# Patient Record
Sex: Male | Born: 1965 | Race: White | Hispanic: No | Marital: Single | Smoking: Former smoker
Health system: Southern US, Community
[De-identification: ages and names within clinical notes are randomized; demographics above are authoritative.]

## PROBLEM LIST (undated history)

## (undated) DIAGNOSIS — I10 Essential (primary) hypertension: Secondary | ICD-10-CM

## (undated) HISTORY — DX: Essential (primary) hypertension: I10

---

## 2014-08-21 ENCOUNTER — Emergency Department (HOSPITAL_BASED_OUTPATIENT_CLINIC_OR_DEPARTMENT_OTHER)
Admission: EM | Admit: 2014-08-21 | Discharge: 2014-08-21 | Disposition: A | Discharge status: Home / Self Care | Payer: BC Managed Care – PPO | Attending: Emergency Medicine | Admitting: Emergency Medicine

## 2014-08-21 ENCOUNTER — Emergency Department (HOSPITAL_BASED_OUTPATIENT_CLINIC_OR_DEPARTMENT_OTHER): Payer: BC Managed Care – PPO

## 2014-08-21 ENCOUNTER — Encounter (HOSPITAL_BASED_OUTPATIENT_CLINIC_OR_DEPARTMENT_OTHER): Payer: Self-pay | Admitting: *Deleted

## 2014-08-21 DIAGNOSIS — R42 Dizziness and giddiness: Secondary | ICD-10-CM | POA: Insufficient documentation

## 2014-08-21 DIAGNOSIS — R079 Chest pain, unspecified: Secondary | ICD-10-CM | POA: Insufficient documentation

## 2014-08-21 DIAGNOSIS — Z87891 Personal history of nicotine dependence: Secondary | ICD-10-CM | POA: Diagnosis not present

## 2014-08-21 LAB — CBC WITH DIFFERENTIAL/PLATELET
Basophils Absolute: 0 10*3/uL (ref 0.0–0.1)
Basophils Relative: 0 % (ref 0–1)
EOS ABS: 0.1 10*3/uL (ref 0.0–0.7)
Eosinophils Relative: 1 % (ref 0–5)
HCT: 46.5 % (ref 39.0–52.0)
HEMOGLOBIN: 15.9 g/dL (ref 13.0–17.0)
Lymphocytes Relative: 23 % (ref 12–46)
Lymphs Abs: 2.1 10*3/uL (ref 0.7–4.0)
MCH: 30.8 pg (ref 26.0–34.0)
MCHC: 34.2 g/dL (ref 30.0–36.0)
MCV: 90.1 fL (ref 78.0–100.0)
MONOS PCT: 7 % (ref 3–12)
Monocytes Absolute: 0.6 10*3/uL (ref 0.1–1.0)
NEUTROS PCT: 69 % (ref 43–77)
Neutro Abs: 6.5 10*3/uL (ref 1.7–7.7)
Platelets: 216 10*3/uL (ref 150–400)
RBC: 5.16 MIL/uL (ref 4.22–5.81)
RDW: 12.8 % (ref 11.5–15.5)
WBC: 9.4 10*3/uL (ref 4.0–10.5)

## 2014-08-21 LAB — BASIC METABOLIC PANEL
Anion gap: 12 (ref 5–15)
BUN: 21 mg/dL (ref 6–23)
CO2: 28 mEq/L (ref 19–32)
Calcium: 9.7 mg/dL (ref 8.4–10.5)
Chloride: 101 mEq/L (ref 96–112)
Creatinine, Ser: 1 mg/dL (ref 0.50–1.35)
GFR calc Af Amer: >90 mL/min (ref >90)
GFR, EST NON AFRICAN AMERICAN: 88 mL/min — AB (ref >90)
Glucose, Bld: 125 mg/dL — ABNORMAL HIGH (ref 70–99)
Potassium: 4.4 mEq/L (ref 3.7–5.3)
Sodium: 141 mEq/L (ref 137–147)

## 2014-08-21 LAB — TROPONIN I: Troponin I: <0.3 ng/mL (ref <0.30)

## 2014-08-21 MED ORDER — OMEPRAZOLE 20 MG PO CPDR: 20.0000 mg | DELAYED_RELEASE_CAPSULE | Freq: Every day | ORAL | Status: DC

## 2014-08-21 MED ORDER — ASPIRIN 81 MG PO CHEW
324.0000 mg | CHEWABLE_TABLET | Freq: Once | ORAL | Status: AC
  Administered 2014-08-21: 324 mg via ORAL
  Filled 2014-08-21: qty 4

## 2014-08-21 MED ORDER — GI COCKTAIL ~~LOC~~
30.0000 mL | Freq: Once | ORAL | Status: AC
Start: 2014-08-21 — End: 2014-08-21
  Administered 2014-08-21: 30 mL via ORAL
  Filled 2014-08-21: qty 30

## 2014-08-21 NOTE — Discharge Instructions (Signed)
Chest Pain (Nonspecific) °It is often hard to give a specific diagnosis for the cause of chest pain. There is always a chance that your pain could be related to something serious, such as a heart attack or a blood clot in the lungs. You need to follow up with your health care provider for further evaluation. °CAUSES  °· Heartburn. °· Pneumonia or bronchitis. °· Anxiety or stress. °· Inflammation around your heart (pericarditis) or lung (pleuritis or pleurisy). °· A blood clot in the lung. °· A collapsed lung (pneumothorax). It can develop suddenly on its own (spontaneous pneumothorax) or from trauma to the chest. °· Shingles infection (herpes zoster virus). °The chest wall is composed of bones, muscles, and cartilage. Any of these can be the source of the pain. °· The bones can be bruised by injury. °· The muscles or cartilage can be strained by coughing or overwork. °· The cartilage can be affected by inflammation and become sore (costochondritis). °DIAGNOSIS  °Lab tests or other studies may be needed to find the cause of your pain. Your health care provider may have you take a test called an ambulatory electrocardiogram (ECG). An ECG records your heartbeat patterns over a 24-hour period. You may also have other tests, such as: °· Transthoracic echocardiogram (TTE). During echocardiography, sound waves are used to evaluate how blood flows through your heart. °· Transesophageal echocardiogram (TEE). °· Cardiac monitoring. This allows your health care provider to monitor your heart rate and rhythm in real time. °· Holter monitor. This is a portable device that records your heartbeat and can help diagnose heart arrhythmias. It allows your health care provider to track your heart activity for several days, if needed. °· Stress tests by exercise or by giving medicine that makes the heart beat faster. °TREATMENT  °· Treatment depends on what may be causing your chest pain. Treatment may include: °¨ Acid blockers for  heartburn. °¨ Anti-inflammatory medicine. °¨ Pain medicine for inflammatory conditions. °¨ Antibiotics if an infection is present. °· You may be advised to change lifestyle habits. This includes stopping smoking and avoiding alcohol, caffeine, and chocolate. °· You may be advised to keep your head raised (elevated) when sleeping. This reduces the chance of acid going backward from your stomach into your esophagus. °Most of the time, nonspecific chest pain will improve within 2-3 days with rest and mild pain medicine.  °HOME CARE INSTRUCTIONS  °· If antibiotics were prescribed, take them as directed. Finish them even if you start to feel better. °· For the next few days, avoid physical activities that bring on chest pain. Continue physical activities as directed. °· Do not use any tobacco products, including cigarettes, chewing tobacco, or electronic cigarettes. °· Avoid drinking alcohol. °· Only take medicine as directed by your health care provider. °· Follow your health care provider's suggestions for further testing if your chest pain does not go away. °· Keep any follow-up appointments you made. If you do not go to an appointment, you could develop lasting (chronic) problems with pain. If there is any problem keeping an appointment, call to reschedule. °SEEK MEDICAL CARE IF:  °· Your chest pain does not go away, even after treatment. °· You have a rash with blisters on your chest. °· You have a fever. °SEEK IMMEDIATE MEDICAL CARE IF:  °· You have increased chest pain or pain that spreads to your arm, neck, jaw, back, or abdomen. °· You have shortness of breath. °· You have an increasing cough, or you cough   up blood. °· You have severe back or abdominal pain. °· You feel nauseous or vomit. °· You have severe weakness. °· You faint. °· You have chills. °This is an emergency. Do not wait to see if the pain will go away. Get medical help at once. Call your local emergency services (911 in [place] Do not drive  yourself to the hospital. °MAKE SURE YOU:  °· Understand these instructions. °· Will watch your condition. °· Will get help right away if you are not doing well or get worse. °Document Released: 06/17/2005 Document Revised: 09/12/2013 Document Reviewed: 04/12/2008 °ExitCare® Patient Information ©2015 [employer]. This information is not intended to replace advice given to you by your health care provider. Make sure you discuss any questions you have with your health care provider. ° ° °Emergency Department Resource Guide °1) Find a Doctor and Pay Out of Pocket °Although you won't have to find out who is covered by your insurance plan, it is a good idea to ask around and get recommendations. You will then need to call the office and see if the doctor you have chosen will accept you as a new patient and what types of options they offer for patients who are self-pay. Some doctors offer discounts or will set up payment plans for their patients who do not have insurance, but you will need to ask so you aren't surprised when you get to your appointment. ° °2) Contact Your Local Health Department °Not all health departments have doctors that can see patients for sick visits, but many do, so it is worth a call to see if yours does. If you don't know where your local health department is, you can check in your phone book. The CDC also has a tool to help you locate your state's health department, and many state websites also have listings of all of their local health departments. ° °3) Find a Walk-in Clinic °If your illness is not likely to be very severe or complicated, you may want to try a walk in clinic. These are popping up all over the country in pharmacies, drugstores, and shopping centers. They're usually staffed by nurse practitioners or physician assistants that have been trained to treat common illnesses and complaints. They're usually fairly quick and inexpensive. However, if you have serious medical issues or  chronic medical problems, these are probably not your best option. ° °No Primary Care Doctor: °- Call [facility] at  832-8000 - they can help you locate a primary care doctor that  accepts your insurance, provides certain services, etc. °- Physician Referral Service- 1-800-533-3463 ° °Chronic Pain Problems: °Organization         Address  Phone   Notes  °[facility]  (336) 297-2271 Patients need to be referred by their primary care doctor.  ° °Medication Assistance: °Organization         Address  Phone   Notes  °[place] [facility] [address] °[place], [place] [address] (336) 641-8030 --Must be a resident of [place] °-- Must have NO insurance coverage whatsoever (no Medicaid/ Medicare, etc.) °-- The pt. MUST have a primary care doctor that directs their care regularly and follows them in the community °  °[facility]  (866) 331-1348   °[employer]  (888) 892-1162   ° °Agencies that provide inexpensive medical care: °Organization         Address  Phone   Notes  °[facility]  (  336) 832-8035   °[facility]    (336) 832-7272   °[facility] [address] °[place], [place] [address] (336) 832-4777   °[facility] [address], °National (336) 271-4999   °[facility]    (336) 373-0678   °[facility]    (336) 272-1050   °[facility] ° [address], [place] Phone:  (336) 832-4444, Fax:  (336) 832-4440 Hours of Operation:  9 am - 6 pm, M-F.  Also accepts Medicaid/Medicare and self-pay.  °[facility] ° [address], [place] Phone: (336) 832-3150, Fax: (336) 832-3151. Hours of Operation:  8:30 am - 5:30 pm, M-F.  Also accepts Medicaid and self-pay.  °[facility] [place] [address], [place] Phone: (336) 878-6027   °[facility] [address], [place], [place]  (336)723-1848, Ext. 123 Mondays & Thursdays: 7-9 AM.  First 15 patients are seen on a first come, first serve basis. °  ° °Medicaid-accepting [place] Providers: ° °Organization         Address  Phone   Notes  °[facility] [address], [place] (336) 641-2100 Also accepts self-pay patients.  °[facility] [address], [place] ° (336) 856-9996   °[facility] [address], [place] (336) 288-8857   °[facility] [address] [place] [address], [place] (336) 299-7000   °Veita Bland [address], [place]  ° (336) 373-1557 Only accepts [place] Access Medicaid patients after they have their name applied to their card.  ° °Self-Pay (no insurance) in [place]: ° °Organization         Address  Phone   Notes  °Sickle Cell Patients, [facility] [address], [place] (336) 832-1970   °[facility] [address], [place] (336) 832-4400   °[facility] [place] ° [address], [place] (336) 992-4800   °[facility]/Dr. Osei-Bonsu ° [address] [place] [address], [place] or [address], [place] (336) 841-8500 Phone number for both [place] and [place] locations is the same.  °[facility] [address], [place] (336) 299-0000   °[facility] [address] [place] [address], [place] or [address] (336) 852-7530 °(336) 878-2260   °[facility] [address], [place] (336) 350-1642, phone; (336) 294-5005, fax Sees patients 1st and 3rd Saturday of every month.  Must not qualify for public or private insurance (i.e. Medicaid, Medicare, [place] Health Choice, Veterans' Benefits) • Household income should be no more than 200% of the poverty level •The clinic cannot treat you if you are pregnant or think you are pregnant • Sexually transmitted  diseases are not treated at the clinic.  ° ° °Dental Care: °Organization         Address  Phone  Notes  °[facility] [facility] [address], [place] (336) 641-6152 Accepts children up to age 21 who are enrolled in Medicaid or [place] Health Choice; pregnant women with a Medicaid card; and children who have applied for Medicaid or [place] Health Choice, but were declined, whose parents can pay a reduced fee at time of service.  °[facility] [place]    [address], [place] (336) 641-7733 Accepts children up to age 21 who are enrolled in Medicaid or [place] Health Choice; pregnant women with a Medicaid card; and children who have applied for Medicaid or [place] Health Choice, but were declined, whose parents can pay a reduced fee at time of service.  °[facility] ° [address], [place] (336) 641-4533 Patients are seen by appointment only. Walk-ins are not accepted. [facility] will see patients 18 years of age and older. °Monday - Tuesday (8am-5pm) °Most Wednesdays (8:30-5pm) °$30 per visit, cash only  °[facility] ° [address], [place] (336) 641-4533 Patients are seen by appointment only. Walk-ins are not accepted. [facility] will see patients 18 years of age and older. °One Wednesday Evening (Monthly: Volunteer Based).  $30 per visit, cash only  °[facility]  (919) 537-3737 for adults; Children under age 4, call Graduate Pediatric Dentistry at (919) [phone]. Children aged 4-14, please call (919) [phone] to request a pediatric application. ° Dental services are provided in all areas of dental care including fillings, crowns and bridges, complete and partial dentures, implants, gum treatment, root canals, and extractions. Preventive care is also provided. Treatment is provided to both adults and children. °Patients are selected via a  lottery and there is often a waiting list. °  °[facility] [address], °[place] ° (336) 763-8833 www.drcivils.com °  °[facility] [address], [place], [place] (336)723-1848, Ext. 123 Second and Fourth Thursday of each month, opens at 6:30 AM; Clinic ends at 9 AM.  Patients are seen on a first-come first-served basis, and a limited number are seen during each clinic.  ° °[facility] ° [address], [place], [place] (336) 723-7904   Eligibility Requirements °You must have lived in [place], [place], or [place] counties for at least the last three months. °  You cannot be eligible for state or federal sponsored healthcare insurance, including Veterans Administration, Medicaid, or Medicare. °  You generally cannot be eligible for healthcare insurance through your employer.  °  How to apply: °Eligibility screenings are held every Tuesday and Wednesday afternoon from 1:00 pm until 4:00 pm. You do not need an appointment for the interview!  °[facility] [address], [place], [place] 336-631-2330   °[facility]  336-342-8273   °[facility]  336-703-3100   °[facility]  336-570-6415   ° °Behavioral Health Resources in the Community: °Intensive Outpatient Programs °Organization         Address  Phone  Notes  °[facility] [address], [place], [place] 336-878-6098   °[facility] [address], [place], [place] 336-832-9800   °[facility] [address], [place], [place] ° 336-882-2125   °[facility] [address],  °[place], [place] 1-800-853-5163 or 336-641-4981   °Substance Abuse Resources °Organization         Address  Phone  Notes  °[facility]  336-882-2125   °[facility]  336-784-9470   °[facility]  336-285-9073   °[facility]  336-845-3988   °Residential &  Outpatient Substance Abuse Program  1-800-659-3381   °Psychological Services °Organization         Address  Phone  Notes  °[facility]  336- 832-9600   °  [facility]  336- 378-7881   °[facility] [address], [place] 1-800-853-5163 or 336-641-4981   ° °Mobile Crisis Teams °Organization         Address  Phone  Notes  °[facility], Mobile Crisis Care Unit  1-877-626-1772   °Assertive °[facility] ° [address] [place], [place] 336-834-9664   °Sharon DeEsch [address] °[place] 336-554-5454   ° °Self-Help/Support Groups °Organization         Address  Phone             Notes  °[facility] [place] [facility] - variety of support groups  336- 373-1402 Call for more information  °Narcotics Anonymous (NA), [facility] [address], °[place]  2 meetings at this location  ° °Residential Treatment Programs °Organization         Address  Phone  Notes  °[facility] [address],    °[place]  1-866-801-8205   °[facility] ° [address], [place], [place] 704-293-8524   °[facility] [address], [place] 336-845-3988 Admissions: 8am-3pm M-F  °[facility] [address]    °[place], [place] 336-841-1104   °[facility] [address], [place], [place] 336-379-7146   °[facility] [address]  °[place], [place] 336-285-9073   °[facility] - Intensive Outpatient [address], [place], [place] 336-852-3033   °[facility] [address]  °[place], [place] 1-877-615-2722 or 336-784-9470   °[facility] [address] [place], [place] 336-227-7417 Accepts Medicaid  °[facility] [address]  °[place] 1-800-659-3381 Substance Abuse/Addiction Treatment  ° °[place] Behavioral Health Resources °Organization          Address  Phone  Notes  °[facility]  (888) 581-9988   °Julie Brannon, PhD [address] [place], [place]   (336) 349-5553 or (336) 951-0000   °[facility]   [address] °[place], [place] (336) 349-4454   °[facility] [address], [place], [place] (336) 342-8316 Insurance/Medicaid/sponsorship through [facility]  °[facility] [address]                                    [place], [place] (336) 342-8316 Therapy/tele-psych/case  °[facility] [address]  ° [place], [place] (336) 349-2233    °Dr. Arfeen  (336) 349-4544   °[facility] [place]  [employer] [place] [facility] 1) [address], [place] °2) [address], [place] °3)  [address], [place] (336) 349-3220 °(336) 342-7768 ° °(336) 342-8140   °[place] [facility] (336) 342-1394 or (336) 342-3537 (After Hours)    ° ° ° °

## 2014-08-21 NOTE — ED Notes (Signed)
Pt to room 6 in w/c, able to stand and walk to bed in nad. Pt reports approx one week of off and on chest "squeezing" sensation. Denies any sob, denies any n/v, diaphoresis or any other c/o.

## 2014-08-21 NOTE — ED Provider Notes (Signed)
CSN: [id number]     Arrival date & time 08/21/14  1035 History   First MD Initiated Contact with Patient 08/21/14 1144     Chief Complaint  Patient presents with  . Chest Pain     (Consider location/radiation/quality/duration/timing/severity/associated sxs/prior Treatment) Patient is a 48 y.o. male presenting with chest pain.  Chest Pain Pain location:  Substernal area Pain quality: tightness   Pain radiates to:  Does not radiate Pain severity:  Moderate Onset quality:  Gradual Duration:  4 days Timing:  Intermittent (but constant for past several hours\) Progression:  Unchanged Chronicity:  New Context comment:  He has been checking his blood pressure for past few days and has noted that it has been high Relieved by:  Nothing Exacerbated by: lying flat, at night. Ineffective treatments:  None tried Associated symptoms: dizziness ("a little bit.  I can work through it")   Associated symptoms: no diaphoresis, no nausea, no near-syncope, no shortness of breath and not vomiting     History reviewed. No pertinent past medical history. History reviewed. No pertinent past surgical history. History reviewed. No pertinent family history. History  Substance Use Topics  . Smoking status: Former Games developermoker  . Smokeless tobacco: Not on file  . Alcohol Use: Not on file    Review of Systems  Constitutional: Negative for diaphoresis.  Respiratory: Negative for shortness of breath.   Cardiovascular: Positive for chest pain. Negative for near-syncope.  Gastrointestinal: Negative for nausea and vomiting.  Neurological: Positive for dizziness ("a little bit.  I can work through it").  All other systems reviewed and are negative.     Allergies  Review of patient's allergies indicates no known allergies.  Home Medications   Prior to Admission medications   Not on File   BP 153/80 mmHg  Pulse 79  Temp(Src) 98.5 F (36.9 C) (Oral)  Resp 18  Ht 5\' 11"  (1.803 m)  Wt 230 lb (104.327  kg)  BMI 32.09 kg/m2  SpO2 99% Physical Exam  Constitutional: He is oriented to person, place, and time. He appears well-developed and well-nourished. No distress.  HENT:  Head: Normocephalic and atraumatic.  Mouth/Throat: Oropharynx is clear and moist.  Eyes: Conjunctivae are normal. Pupils are equal, round, and reactive to light. No scleral icterus.  Neck: Neck supple.  Cardiovascular: Normal rate, regular rhythm, normal heart sounds and intact distal pulses.   No murmur heard. Pulmonary/Chest: Effort normal and breath sounds normal. No stridor. No respiratory distress. He has no wheezes. He has no rales. He exhibits no tenderness.  Abdominal: Soft. He exhibits no distension. There is no tenderness.  Musculoskeletal: Normal range of motion. He exhibits no edema.  Neurological: He is alert and oriented to person, place, and time.  Skin: Skin is warm and dry. No rash noted.  Psychiatric: He has a normal mood and affect. His behavior is normal.  Nursing note and vitals reviewed.   ED Course  Procedures (including critical care time) Labs Review Labs Reviewed  BASIC METABOLIC PANEL - Abnormal; Notable for the following:    Glucose, Bld 125 (*)    GFR calc non Af Amer 88 (*)    All other components within normal limits  CBC WITH DIFFERENTIAL  TROPONIN I    Imaging Review Dg Chest 2 View  08/21/2014   CLINICAL DATA:  Chest pain for several days  EXAM: CHEST  2 VIEW  COMPARISON:  None.  FINDINGS: Lungs are clear. Heart size and pulmonary vascularity are normal. No pneumothorax.  No adenopathy. No bone lesions.  IMPRESSION: No edema or consolidation.   Electronically Signed   By: Bretta BangWilliam  Woodruff M.D.   On: 08/21/2014 13:09  All radiology studies independently viewed by me.      EKG Interpretation   Date/Time:  Tuesday August 21 2014 10:46:52 EST Ventricular Rate:  72 PR Interval:  172 QRS Duration: 88 QT Interval:  372 QTC Calculation: 407 R Axis:   59 Text  Interpretation:  Normal sinus rhythm Normal ECG No old tracing to  compare Confirmed by [facility]  MD, TREY ([id number]) on 08/21/2014 12:03:41 PM      MDM   Final diagnoses:  Chest pain    Well appearing 48 yo male with intermittent chest pain for past several days, constant this morning.  Low risk for ACS.  Story sounds more consistent with GI related chest pain.  Unlikely dissection or PE.  Troponin is negative. He has had constant chest pain since at least 5:30 this morning. I don't think he needs a second troponin, as his history is very unlikely represent ACS. Plan DC home with Prilosec, return precautions, and PCP follow-up.  Warnell Foresterrey Lowry Bala, MD 08/21/14 [phone]

## 2015-03-07 ENCOUNTER — Emergency Department (HOSPITAL_BASED_OUTPATIENT_CLINIC_OR_DEPARTMENT_OTHER): Payer: 59

## 2015-03-07 ENCOUNTER — Encounter (HOSPITAL_BASED_OUTPATIENT_CLINIC_OR_DEPARTMENT_OTHER): Payer: Self-pay | Admitting: *Deleted

## 2015-03-07 ENCOUNTER — Emergency Department (HOSPITAL_BASED_OUTPATIENT_CLINIC_OR_DEPARTMENT_OTHER)
Admission: EM | Admit: 2015-03-07 | Discharge: 2015-03-07 | Disposition: A | Discharge status: Home / Self Care | Payer: 59 | Attending: Emergency Medicine | Admitting: Emergency Medicine

## 2015-03-07 DIAGNOSIS — R319 Hematuria, unspecified: Secondary | ICD-10-CM

## 2015-03-07 DIAGNOSIS — Z79899 Other long term (current) drug therapy: Secondary | ICD-10-CM | POA: Diagnosis not present

## 2015-03-07 DIAGNOSIS — N390 Urinary tract infection, site not specified: Secondary | ICD-10-CM | POA: Diagnosis not present

## 2015-03-07 DIAGNOSIS — R109 Unspecified abdominal pain: Secondary | ICD-10-CM

## 2015-03-07 DIAGNOSIS — Z87891 Personal history of nicotine dependence: Secondary | ICD-10-CM | POA: Diagnosis not present

## 2015-03-07 LAB — URINALYSIS, ROUTINE W REFLEX MICROSCOPIC
Glucose, UA: NEGATIVE mg/dL
Ketones, ur: 15 mg/dL — AB
NITRITE: NEGATIVE
Protein, ur: 30 mg/dL — AB
Specific Gravity, Urine: 1.029 (ref 1.005–1.030)
UROBILINOGEN UA: 2 mg/dL — AB (ref 0.0–1.0)
pH: 6 (ref 5.0–8.0)

## 2015-03-07 LAB — URINE MICROSCOPIC-ADD ON

## 2015-03-07 LAB — CBC WITH DIFFERENTIAL/PLATELET
BASOS PCT: 0 % (ref 0–1)
Basophils Absolute: 0 10*3/uL (ref 0.0–0.1)
EOS ABS: 0.2 10*3/uL (ref 0.0–0.7)
Eosinophils Relative: 1 % (ref 0–5)
HEMATOCRIT: 44.7 % (ref 39.0–52.0)
HEMOGLOBIN: 15.4 g/dL (ref 13.0–17.0)
Lymphocytes Relative: 21 % (ref 12–46)
Lymphs Abs: 2.4 10*3/uL (ref 0.7–4.0)
MCH: 31.2 pg (ref 26.0–34.0)
MCHC: 34.5 g/dL (ref 30.0–36.0)
MCV: 90.5 fL (ref 78.0–100.0)
MONO ABS: 1 10*3/uL (ref 0.1–1.0)
MONOS PCT: 9 % (ref 3–12)
Neutro Abs: 7.8 10*3/uL — ABNORMAL HIGH (ref 1.7–7.7)
Neutrophils Relative %: 69 % (ref 43–77)
Platelets: 247 10*3/uL (ref 150–400)
RBC: 4.94 MIL/uL (ref 4.22–5.81)
RDW: 12.7 % (ref 11.5–15.5)
WBC: 11.3 10*3/uL — ABNORMAL HIGH (ref 4.0–10.5)

## 2015-03-07 LAB — BASIC METABOLIC PANEL
Anion gap: 9 (ref 5–15)
BUN: 27 mg/dL — ABNORMAL HIGH (ref 6–20)
CO2: 25 mmol/L (ref 22–32)
CREATININE: 1.01 mg/dL (ref 0.61–1.24)
Calcium: 9.1 mg/dL (ref 8.9–10.3)
Chloride: 106 mmol/L (ref 101–111)
GFR calc Af Amer: >60 mL/min (ref >60)
Glucose, Bld: 108 mg/dL — ABNORMAL HIGH (ref 65–99)
Potassium: 3.7 mmol/L (ref 3.5–5.1)
Sodium: 140 mmol/L (ref 135–145)

## 2015-03-07 MED ORDER — CEPHALEXIN 500 MG PO CAPS: 500.0000 mg | ORAL_CAPSULE | Freq: Three times a day (TID) | ORAL | Status: DC

## 2015-03-07 MED ORDER — OXYCODONE-ACETAMINOPHEN 5-325 MG PO TABS: 1.0000 | ORAL_TABLET | ORAL | Status: DC | PRN

## 2015-03-07 MED ORDER — KETOROLAC TROMETHAMINE 60 MG/2ML IM SOLN
60.0000 mg | Freq: Once | INTRAMUSCULAR | Status: AC
  Administered 2015-03-07: 60 mg via INTRAMUSCULAR
  Filled 2015-03-07: qty 2

## 2015-03-07 NOTE — ED Provider Notes (Signed)
CSN: [id number]     Arrival date & time 03/07/15  1726 History   First MD Initiated Contact with Patient 03/07/15 1745     Chief Complaint  Patient presents with  . Flank Pain     (Consider location/radiation/quality/duration/timing/severity/associated sxs/prior Treatment) HPI  49 year old male history of kidney stones this today with left flank pain that began earlier this morning. Has been intermittent throughout the day. He has noticed dark urine. It is similar to previous kidney stones. It is currently decreased at 1-2 out of 10. He has had nausea but no vomiting. He has taken over-the-counter pain medicine with some relief.  History reviewed. No pertinent past medical history. History reviewed. No pertinent past surgical history. No family history on file. History  Substance Use Topics  . Smoking status: Former Games developer  . Smokeless tobacco: Not on file  . Alcohol Use: No    Review of Systems  All other systems reviewed and are negative.     Allergies  Review of patient's allergies indicates no known allergies.  Home Medications   Prior to Admission medications   Medication Sig Start Date End Date Taking? Authorizing Provider  omeprazole (PRILOSEC) 20 MG capsule Take 1 capsule (20 mg total) by mouth daily. 08/21/14   Blake Divine, MD   BP 164/86 mmHg  Pulse 67  Temp(Src) 97.4 F (36.3 C) (Oral)  Resp 18  Ht 5\' 10"  (1.778 m)  Wt 230 lb (104.327 kg)  BMI 33.00 kg/m2  SpO2 99% Physical Exam  Constitutional: He is oriented to person, place, and time. He appears well-developed and well-nourished.  HENT:  Head: Normocephalic and atraumatic.  Right Ear: External ear normal.  Left Ear: External ear normal.  Nose: Nose normal.  Mouth/Throat: Oropharynx is clear and moist.  Eyes: Conjunctivae and EOM are normal. Pupils are equal, round, and reactive to light.  Neck: Normal range of motion. Neck supple.  Cardiovascular: Normal rate, regular rhythm, normal heart sounds  and intact distal pulses.   Pulmonary/Chest: Effort normal and breath sounds normal. No respiratory distress. He has no wheezes. He exhibits no tenderness.  Abdominal: Soft. Bowel sounds are normal. He exhibits no distension and no mass. There is no tenderness. There is no guarding.  Musculoskeletal: Normal range of motion.  Neurological: He is alert and oriented to person, place, and time. He has normal reflexes. He exhibits normal muscle tone. Coordination normal.  Skin: Skin is warm and dry.  Psychiatric: He has a normal mood and affect. His behavior is normal. Judgment and thought content normal.  Nursing note and vitals reviewed.   ED Course  Procedures (including critical care time) Labs Review Labs Reviewed  URINALYSIS, ROUTINE W REFLEX MICROSCOPIC (NOT AT [facility]) - Abnormal; Notable for the following:    Color, Urine RED (*)    APPearance CLOUDY (*)    Hgb urine dipstick LARGE (*)    Bilirubin Urine SMALL (*)    Ketones, ur 15 (*)    Protein, ur 30 (*)    Urobilinogen, UA 2.0 (*)    Leukocytes, UA SMALL (*)    All other components within normal limits  BASIC METABOLIC PANEL - Abnormal; Notable for the following:    Glucose, Bld 108 (*)    BUN 27 (*)    All other components within normal limits  CBC WITH DIFFERENTIAL/PLATELET - Abnormal; Notable for the following:    WBC 11.3 (*)    Neutro Abs 7.8 (*)    All other components within normal  limits  URINE MICROSCOPIC-ADD ON - Abnormal; Notable for the following:    Bacteria, UA FEW (*)    All other components within normal limits    Imaging Review Dg Abd 1 View  03/07/2015   CLINICAL DATA:  Sudden onset of left flank pain today. History of kidney stones. Initial encounter.  EXAM: ABDOMEN - 1 VIEW  COMPARISON:  07/26/2008 radiographs.  CT 11/23/2006.  FINDINGS: The upper abdomen is incompletely visualized. The upper pole of the left kidney may be partially excluded. No calcifications are seen over the kidneys or expected  course of the ureters. Right pelvic calcification is unchanged, consistent with a phlebolith. The visualized bowel gas pattern is normal. No osseous abnormalities demonstrated.  IMPRESSION: No radiographic evidence of urinary tract calculus.   Electronically Signed   By: Carey Bullocks M.D.   On: 03/07/2015 18:55     EKG Interpretation None      MDM   Final diagnoses:  Hematuria  UTI (lower urinary tract infection)  Left flank pain    49 year old male history of kidney presents today with left flank pain, hematuria and some bacteria in his urine. He is placed on Keflex for UTI. Urine will be cultured. He is given referral to urology for follow-up.    Margarita Grizzle, MD 03/07/15 [phone]

## 2015-03-07 NOTE — ED Notes (Signed)
Left flank pain intermittent off and on today. Hx of kidney stones.

## 2015-11-26 IMAGING — CR DG CHEST 2V
2 series · 2 of 2 positions shown · non-contrast
Comparison: None.

CLINICAL DATA: Chest pain for several days

EXAM:
CHEST  2 VIEW

[w chest pa]
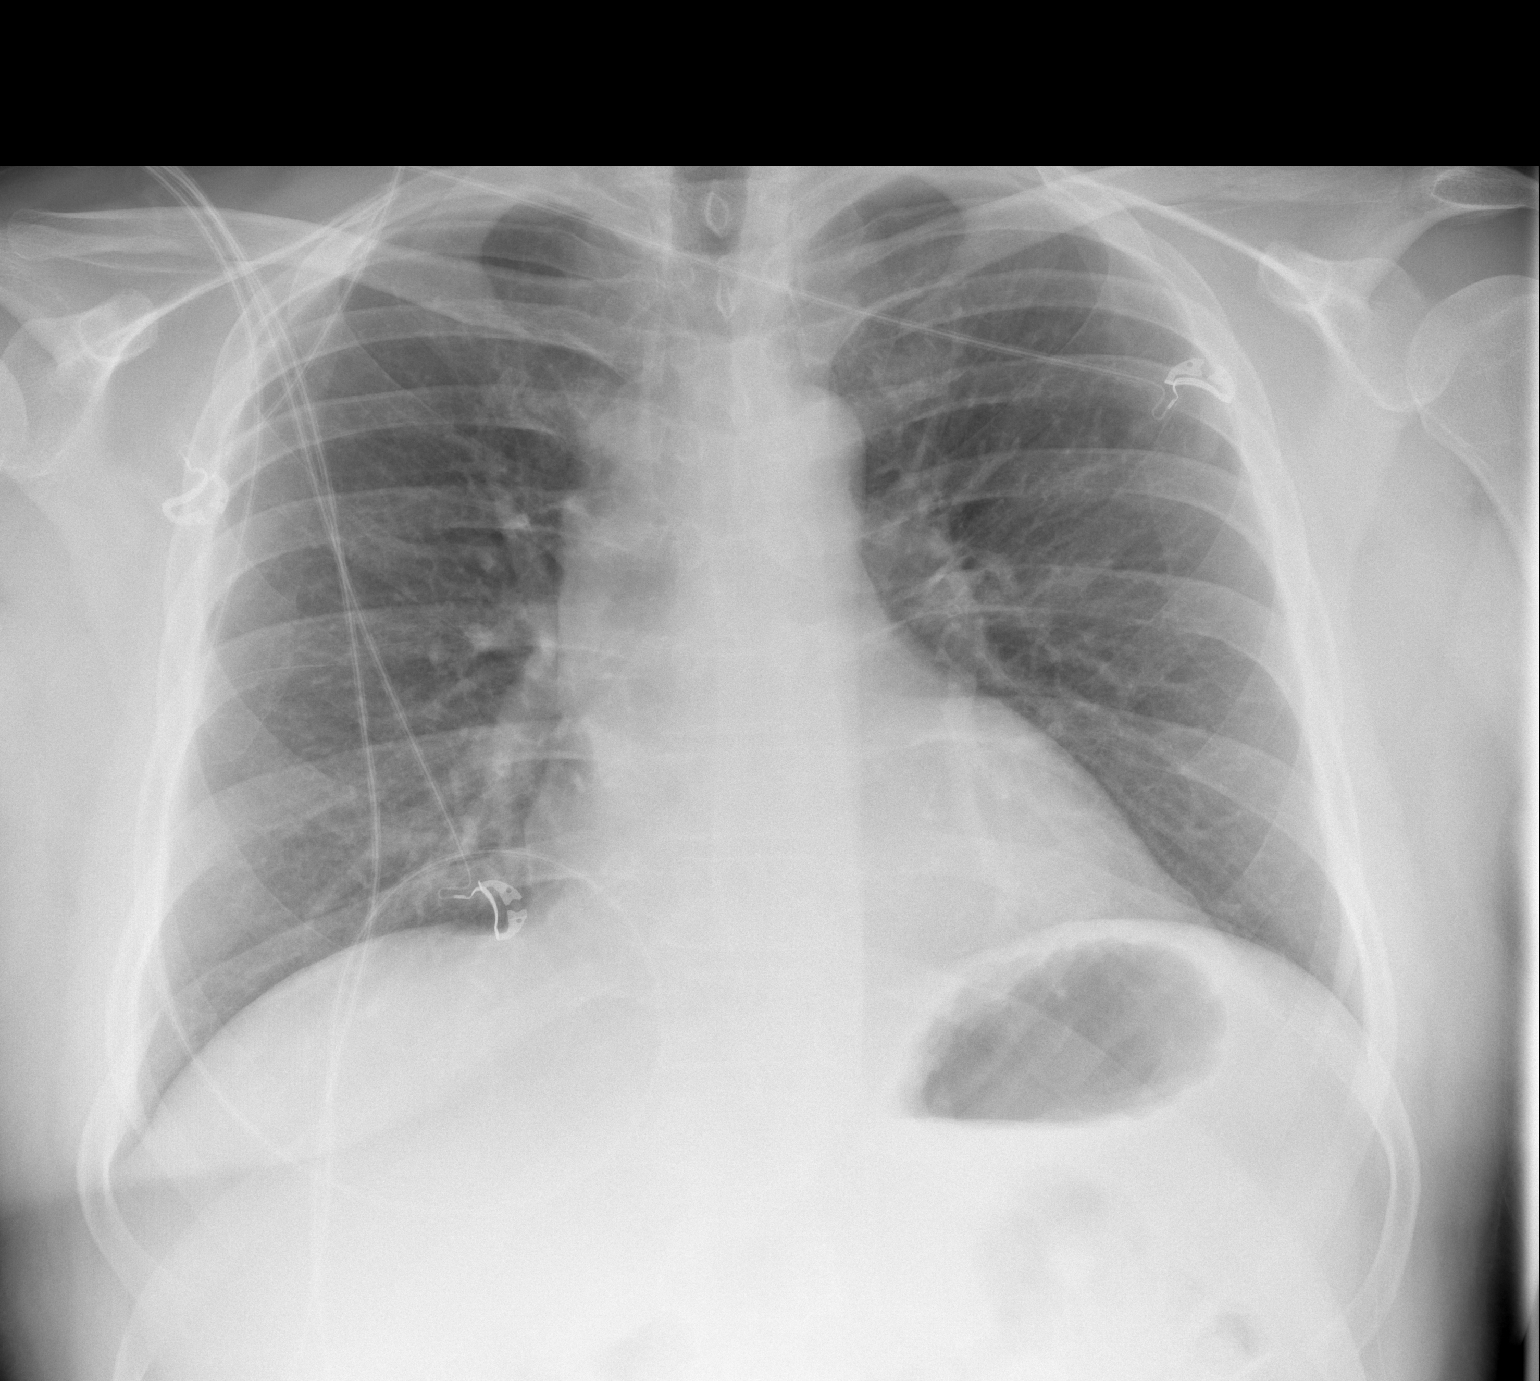

[w chest lat]
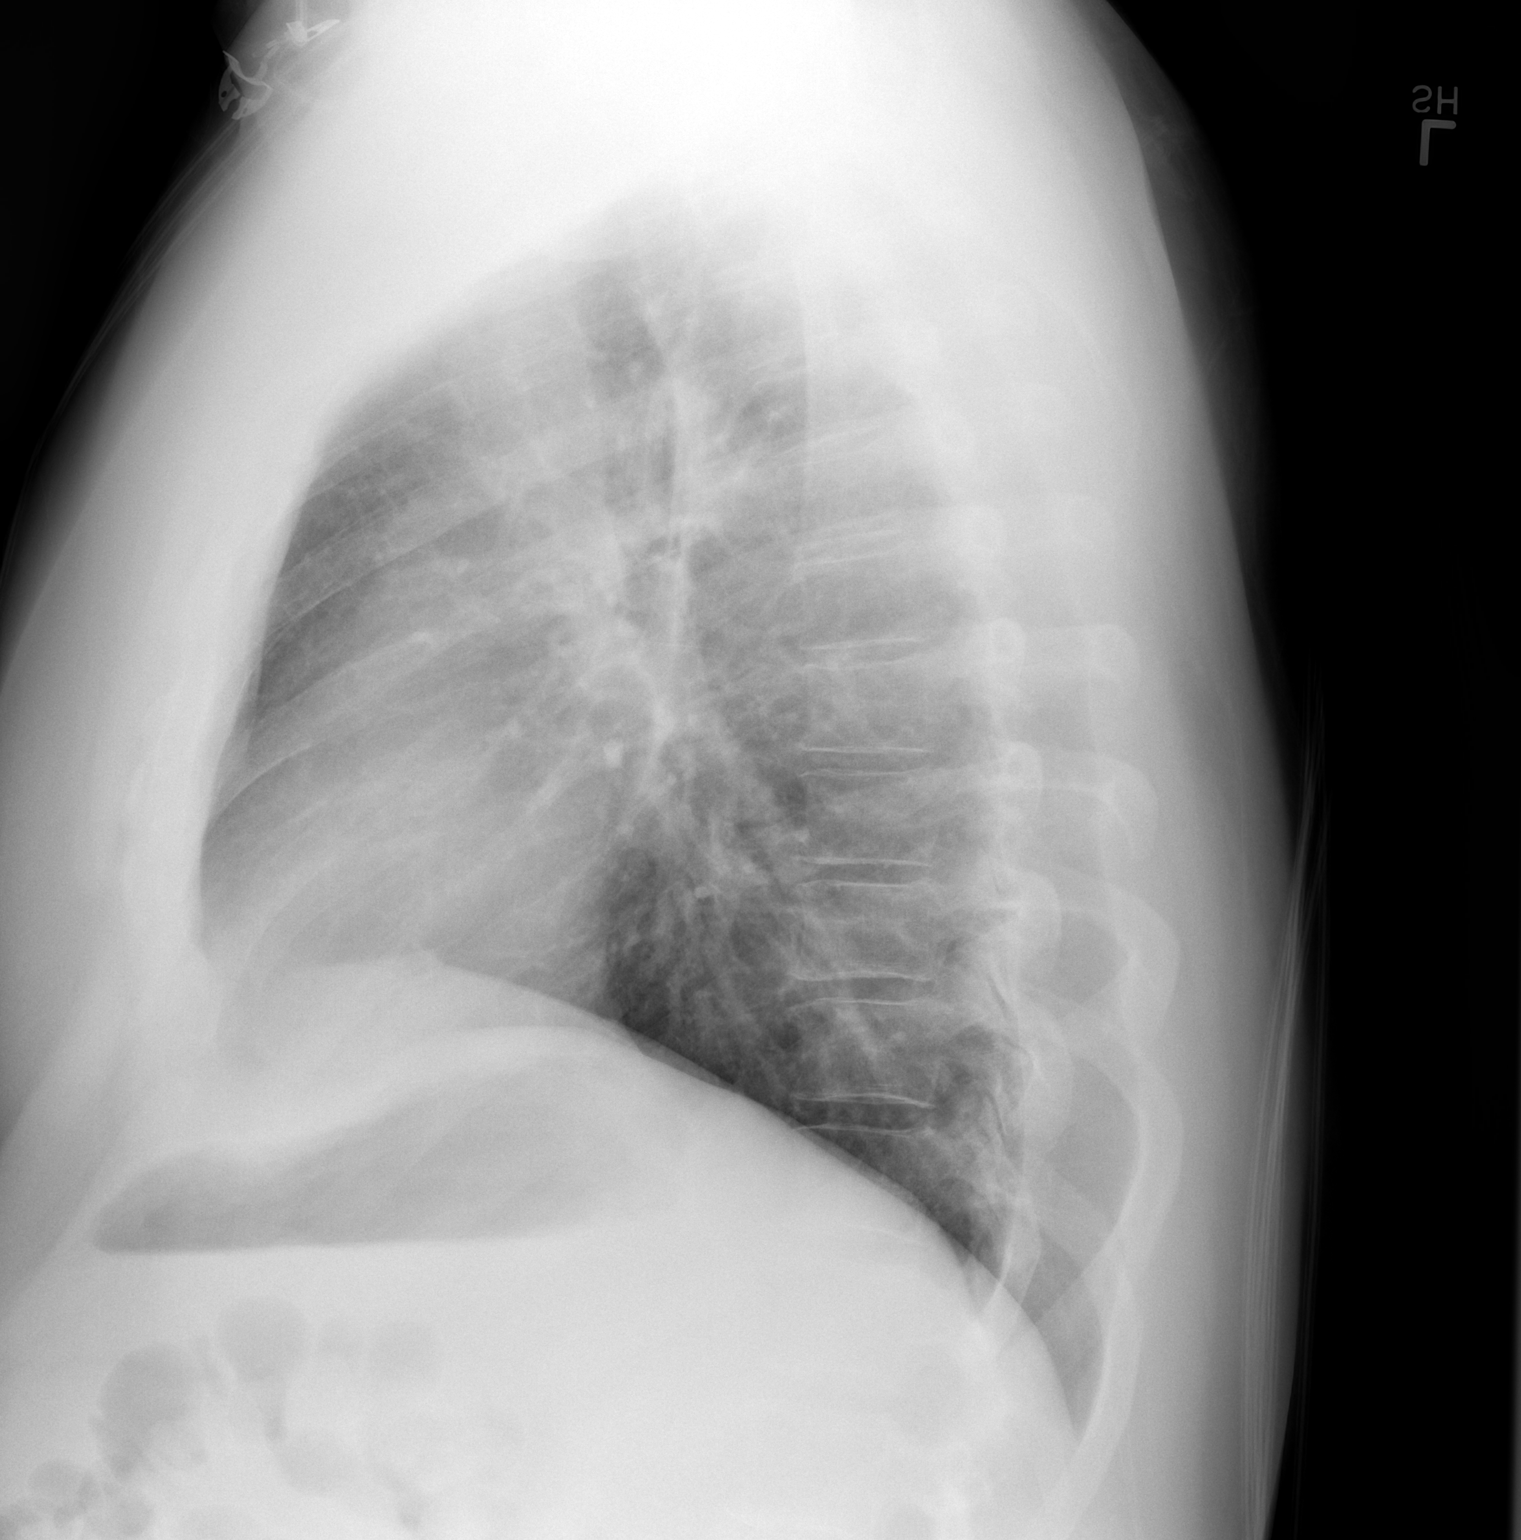

[2 of 2 positions shown; findings below may reference images not displayed]

FINDINGS: Lungs are clear. Heart size and pulmonary vascularity are normal. No
pneumothorax. No adenopathy. No bone lesions.
IMPRESSION: No edema or consolidation.

## 2016-06-11 IMAGING — CR DG ABDOMEN 1V
2 series · 2 of 2 positions shown · non-contrast
Comparison: 07/26/2008 radiographs.  CT 11/23/2006.

CLINICAL DATA: Sudden onset of left flank pain today. History of
kidney stones. Initial encounter.

EXAM:
ABDOMEN - 1 VIEW

[t abdomen supine (1 of 2)]
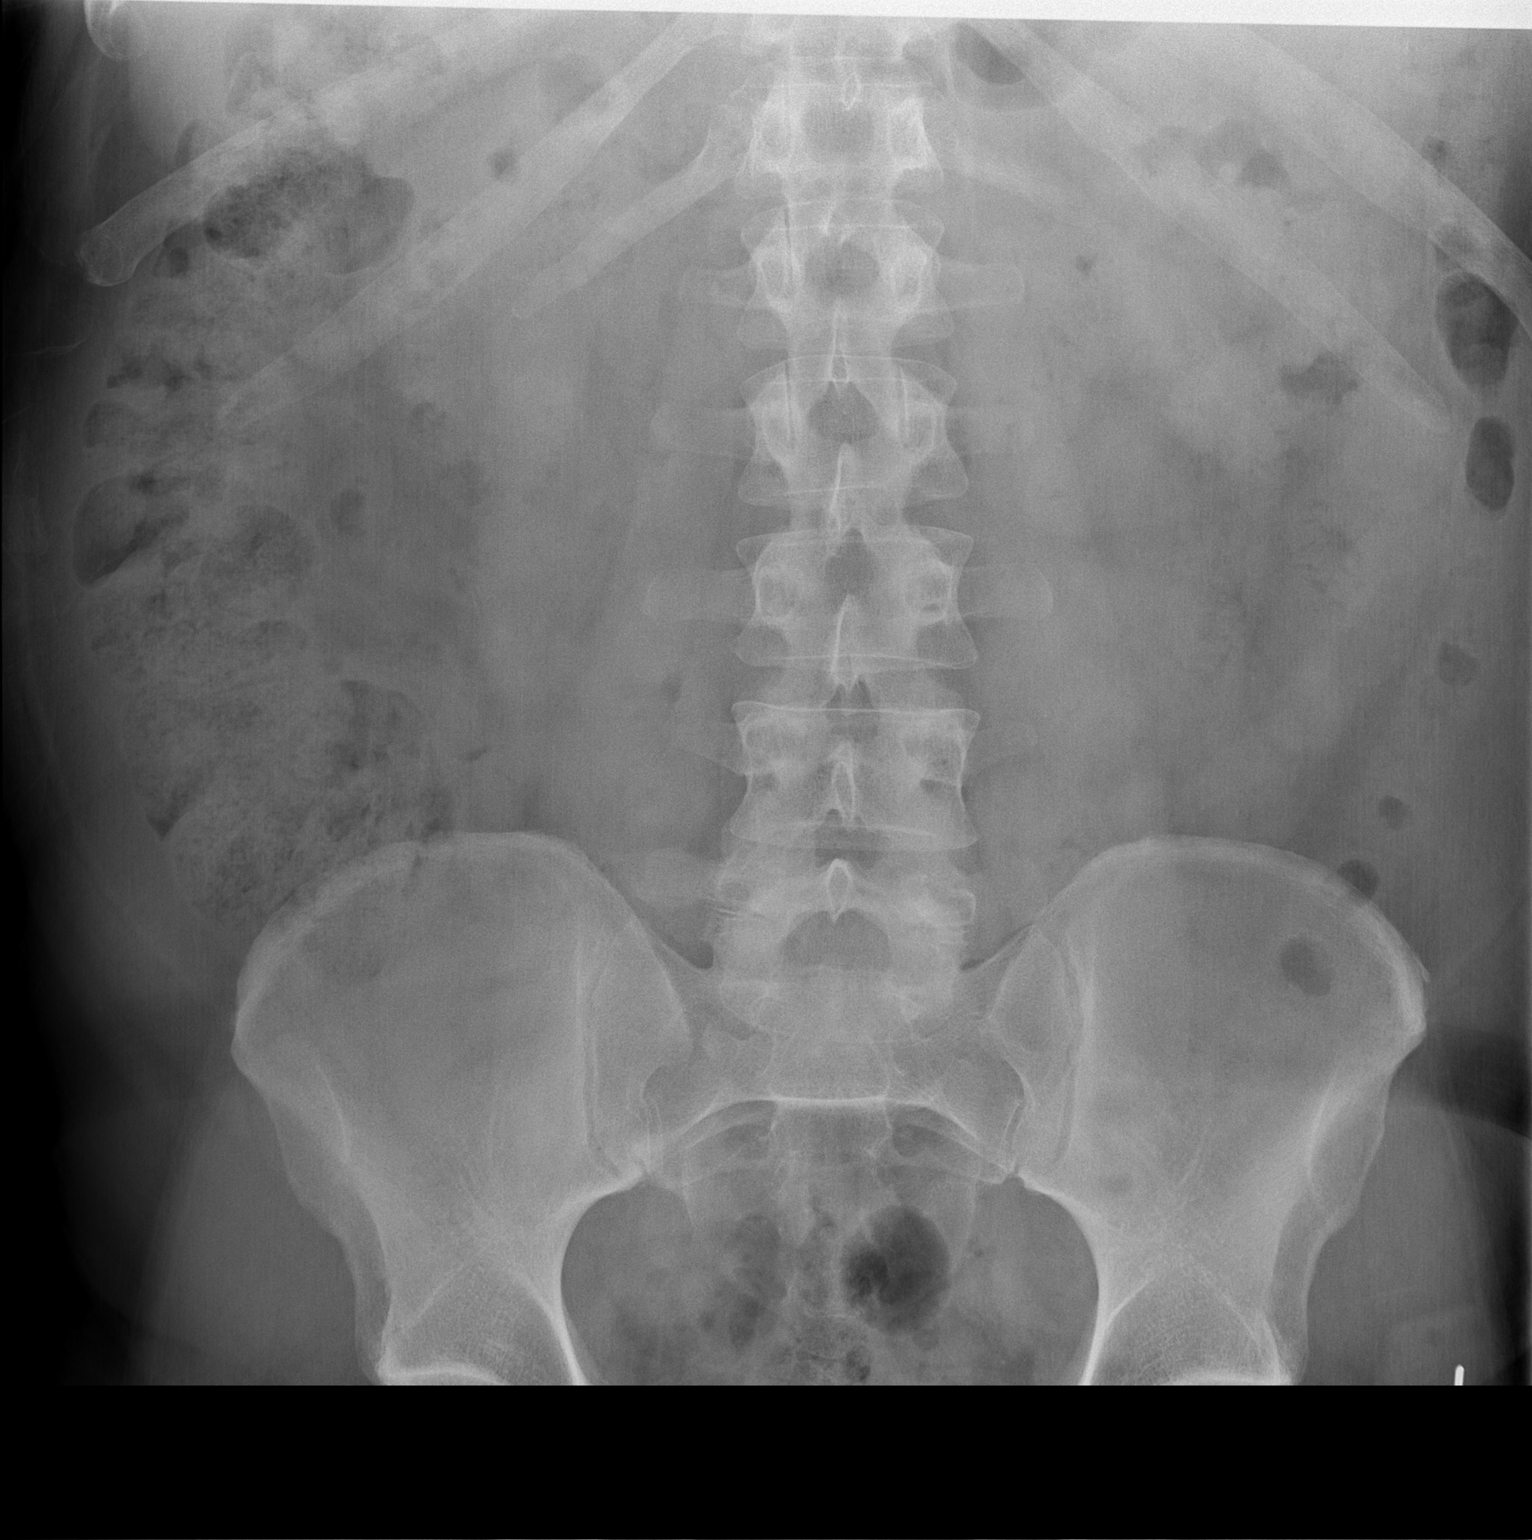

[t abdomen supine (2 of 2)]
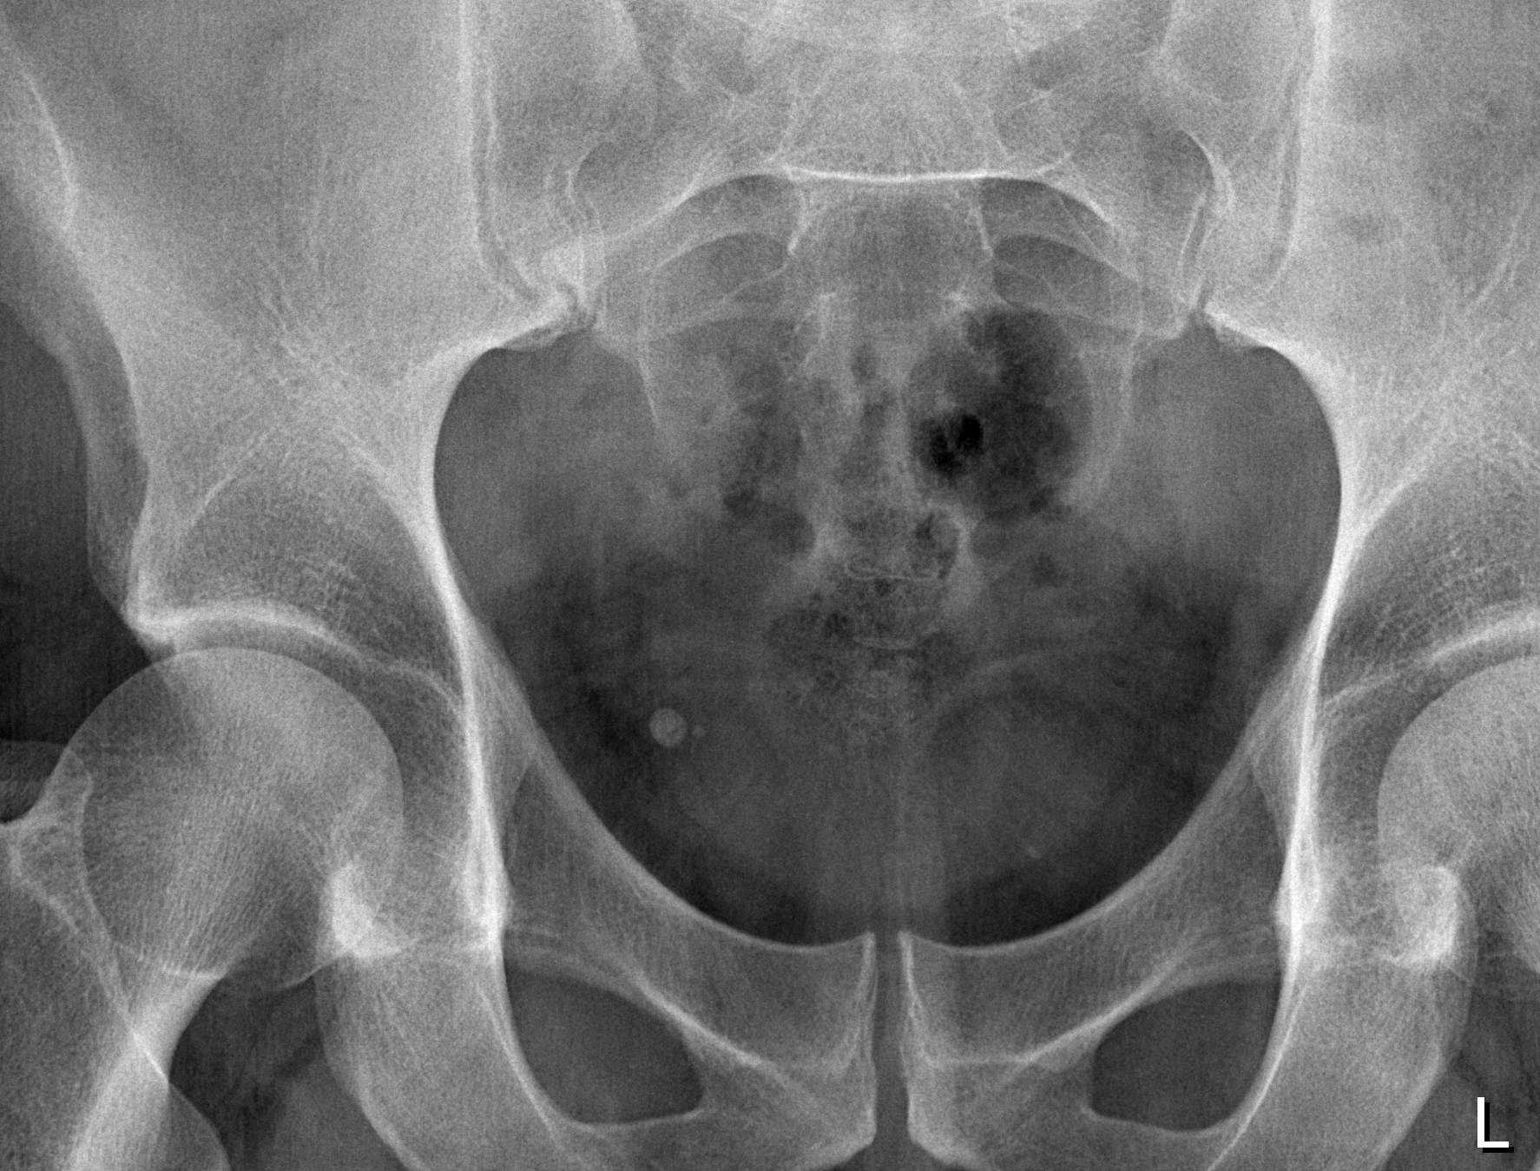

[2 of 2 positions shown; findings below may reference images not displayed]

FINDINGS: The upper abdomen is incompletely visualized. The upper pole of the
left kidney may be partially excluded. No calcifications are seen
over the kidneys or expected course of the ureters. Right pelvic
calcification is unchanged, consistent with a phlebolith. The
visualized bowel gas pattern is normal. No osseous abnormalities
demonstrated.
IMPRESSION: No radiographic evidence of urinary tract calculus.

## 2016-11-09 DIAGNOSIS — G4700 Insomnia, unspecified: Secondary | ICD-10-CM | POA: Insufficient documentation

## 2016-11-09 DIAGNOSIS — K219 Gastro-esophageal reflux disease without esophagitis: Secondary | ICD-10-CM | POA: Insufficient documentation

## 2016-11-09 DIAGNOSIS — I10 Essential (primary) hypertension: Secondary | ICD-10-CM | POA: Insufficient documentation

## 2017-04-29 ENCOUNTER — Emergency Department (HOSPITAL_BASED_OUTPATIENT_CLINIC_OR_DEPARTMENT_OTHER): Payer: BLUE CROSS/BLUE SHIELD

## 2017-04-29 ENCOUNTER — Encounter (HOSPITAL_BASED_OUTPATIENT_CLINIC_OR_DEPARTMENT_OTHER): Payer: Self-pay | Admitting: Emergency Medicine

## 2017-04-29 ENCOUNTER — Emergency Department (HOSPITAL_BASED_OUTPATIENT_CLINIC_OR_DEPARTMENT_OTHER)
Admission: EM | Admit: 2017-04-29 | Discharge: 2017-04-29 | Disposition: A | Discharge status: Home / Self Care | Payer: BLUE CROSS/BLUE SHIELD | Attending: Emergency Medicine | Admitting: Emergency Medicine

## 2017-04-29 DIAGNOSIS — S90911A Unspecified superficial injury of right ankle, initial encounter: Secondary | ICD-10-CM | POA: Diagnosis present

## 2017-04-29 DIAGNOSIS — Y9289 Other specified places as the place of occurrence of the external cause: Secondary | ICD-10-CM | POA: Insufficient documentation

## 2017-04-29 DIAGNOSIS — W1842XA Slipping, tripping and stumbling without falling due to stepping into hole or opening, initial encounter: Secondary | ICD-10-CM | POA: Insufficient documentation

## 2017-04-29 DIAGNOSIS — Y939 Activity, unspecified: Secondary | ICD-10-CM | POA: Insufficient documentation

## 2017-04-29 DIAGNOSIS — S93401A Sprain of unspecified ligament of right ankle, initial encounter: Secondary | ICD-10-CM | POA: Insufficient documentation

## 2017-04-29 DIAGNOSIS — Y999 Unspecified external cause status: Secondary | ICD-10-CM | POA: Insufficient documentation

## 2017-04-29 DIAGNOSIS — Z87891 Personal history of nicotine dependence: Secondary | ICD-10-CM | POA: Insufficient documentation

## 2017-04-29 MED ORDER — OXYCODONE-ACETAMINOPHEN 5-325 MG PO TABS
1.0000 | ORAL_TABLET | ORAL | Status: DC | PRN
  Administered 2017-04-29: 1 via ORAL
  Filled 2017-04-29: qty 1

## 2017-04-29 MED ORDER — IBUPROFEN 400 MG PO TABS
600.0000 mg | ORAL_TABLET | Freq: Once | ORAL | Status: AC
  Administered 2017-04-29: 600 mg via ORAL
  Filled 2017-04-29: qty 1

## 2017-04-29 NOTE — ED Provider Notes (Addendum)
[facility]   CSN: [id number] Arrival date & time: 04/29/17  4098     History   Chief Complaint Chief Complaint  Patient presents with  . Ankle Pain    HPI Charles Little is a 51 y.o. male.  HPI  51 year old male presents with right ankle pain after twisting his ankle. He was stepping up off of a step and stepped into a hold it was covered up by grass. His ankle twisted. He has been able to walk but with a limp. He also has some medial proximal foot pain. Otherwise did not fall. No weakness or numbness. Was given Percocet and ice prior to me seeing him.  No past medical history on file.  There are no active problems to display for this patient.   No past surgical history on file.     Home Medications    Prior to Admission medications   Not on File    Family History No family history on file.  Social History Social History  Substance Use Topics  . Smoking status: Former Games developer  . Smokeless tobacco: Never Used  . Alcohol use No     Allergies   Patient has no known allergies.   Review of Systems Review of Systems  Musculoskeletal: Positive for arthralgias and joint swelling.  Neurological: Negative for weakness and numbness.  All other systems reviewed and are negative.    Physical Exam Updated Vital Signs BP (!) 156/102 (BP Location: Right Arm)   Pulse (!) 58   Temp 98.2 F (36.8 C) (Oral)   Resp 20   Ht 5\' 10"  (1.778 m)   Wt 111.1 kg (245 lb)   SpO2 99%   BMI 35.15 kg/m   Physical Exam  Constitutional: He is oriented to person, place, and time. He appears well-developed and well-nourished.  HENT:  Head: Normocephalic and atraumatic.  Right Ear: External ear normal.  Left Ear: External ear normal.  Nose: Nose normal.  Eyes: Right eye exhibits no discharge. Left eye exhibits no discharge.  Neck: Neck supple.  Cardiovascular: Normal rate and regular rhythm.   Pulses:      Dorsalis pedis pulses are 2+ on the  right side.  Pulmonary/Chest: Effort normal.  Musculoskeletal: He exhibits no edema.       Right knee: No tenderness found.       Right ankle: He exhibits swelling. He exhibits normal range of motion and no ecchymosis. Tenderness. Achilles tendon normal. Achilles tendon exhibits no pain and no defect.       Right lower leg: He exhibits no tenderness.       Right foot: There is tenderness. There is no swelling.       Feet:  Neurological: He is alert and oriented to person, place, and time.  Skin: Skin is warm and dry.  Nursing note and vitals reviewed.    ED Treatments / Results  Labs (all labs ordered are listed, but only abnormal results are displayed) Labs Reviewed - No data to display  EKG  EKG Interpretation None       Radiology Dg Ankle Complete Right  Result Date: 04/29/2017 CLINICAL DATA:  Right ankle pain after injury today. EXAM: RIGHT ANKLE - COMPLETE 3+ VIEW COMPARISON:  Radiographs of September 09, 2006. FINDINGS: There is no evidence of acute fracture, dislocation, or joint effusion. There is no evidence of arthropathy or other focal bone abnormality. Soft tissues are unremarkable. IMPRESSION: No significant abnormality seen in the right ankle. Electronically  Signed   By: Lupita RaiderJames  Green Jr, M.D.   On: 04/29/2017 10:04   Dg Foot Complete Right  Result Date: 04/29/2017 CLINICAL DATA:  Foot pain medially after rolling foot EXAM: RIGHT FOOT COMPLETE - 3+ VIEW COMPARISON:  None. FINDINGS: No acute fracture is seen. Alignment is normal. There is some degenerative change from the dorsal aspect of the talus which may be due to prior trauma. IMPRESSION: No acute abnormality. Electronically Signed   By: Dwyane DeePaul  Barry M.D.   On: 04/29/2017 10:41    Procedures Procedures (including critical care time)  Medications Ordered in ED Medications  oxyCODONE-acetaminophen (PERCOCET/ROXICET) 5-325 MG per tablet 1 tablet (1 tablet Oral Given 04/29/17 0958)  ibuprofen (ADVIL,MOTRIN) tablet  600 mg (600 mg Oral Given 04/29/17 1058)     Initial Impression / Assessment and Plan / ED Course  I have reviewed the triage vital signs and the nursing notes.  Pertinent labs & imaging results that were available during my care of the patient were reviewed by me and considered in my medical decision making (see chart for details).     X-rays are unremarkable. Consistent with an ankle sprain. Foot x-ray without fracture. He will be treated with RICE. He already has crutches at home and does not want more. Will place an Ace wrap. Follow-up with PCP.  Final Clinical Impressions(s) / ED Diagnoses   Final diagnoses:  Sprain of right ankle, unspecified ligament, initial encounter    New Prescriptions New Prescriptions   No medications on file     Pricilla LovelessGoldston, Lafawn Lenoir, MD 04/29/17 1106    Pricilla LovelessGoldston, Rocsi Hazelbaker, MD 04/29/17 1106

## 2017-04-29 NOTE — ED Triage Notes (Signed)
Pt injured right ankle while stepping off platform and stepped into a hole.  Noted swelling and bruising.

## 2018-02-03 ENCOUNTER — Other Ambulatory Visit: Payer: Self-pay

## 2018-02-03 ENCOUNTER — Emergency Department (HOSPITAL_BASED_OUTPATIENT_CLINIC_OR_DEPARTMENT_OTHER)
Admission: EM | Admit: 2018-02-03 | Discharge: 2018-02-03 | Disposition: A | Discharge status: Home / Self Care | Payer: BLUE CROSS/BLUE SHIELD | Attending: Emergency Medicine | Admitting: Emergency Medicine

## 2018-02-03 ENCOUNTER — Encounter (HOSPITAL_BASED_OUTPATIENT_CLINIC_OR_DEPARTMENT_OTHER): Payer: Self-pay | Admitting: Emergency Medicine

## 2018-02-03 DIAGNOSIS — Z23 Encounter for immunization: Secondary | ICD-10-CM | POA: Insufficient documentation

## 2018-02-03 DIAGNOSIS — X131XXA Other contact with steam and other hot vapors, initial encounter: Secondary | ICD-10-CM

## 2018-02-03 DIAGNOSIS — Z87891 Personal history of nicotine dependence: Secondary | ICD-10-CM | POA: Diagnosis not present

## 2018-02-03 DIAGNOSIS — T2020XA Burn of second degree of head, face, and neck, unspecified site, initial encounter: Secondary | ICD-10-CM | POA: Diagnosis not present

## 2018-02-03 DIAGNOSIS — T22221A Burn of second degree of right elbow, initial encounter: Secondary | ICD-10-CM | POA: Insufficient documentation

## 2018-02-03 DIAGNOSIS — Y929 Unspecified place or not applicable: Secondary | ICD-10-CM | POA: Diagnosis not present

## 2018-02-03 DIAGNOSIS — Y939 Activity, unspecified: Secondary | ICD-10-CM | POA: Diagnosis not present

## 2018-02-03 DIAGNOSIS — T2000XA Burn of unspecified degree of head, face, and neck, unspecified site, initial encounter: Secondary | ICD-10-CM | POA: Diagnosis present

## 2018-02-03 DIAGNOSIS — T2121XA Burn of second degree of chest wall, initial encounter: Secondary | ICD-10-CM | POA: Diagnosis not present

## 2018-02-03 DIAGNOSIS — Y999 Unspecified external cause status: Secondary | ICD-10-CM | POA: Diagnosis not present

## 2018-02-03 HISTORY — DX: Essential (primary) hypertension: I10

## 2018-02-03 LAB — CBC WITH DIFFERENTIAL/PLATELET
Basophils Absolute: 0 10*3/uL (ref 0.0–0.1)
Basophils Relative: 0 %
Eosinophils Absolute: 0.1 10*3/uL (ref 0.0–0.7)
Eosinophils Relative: 2 %
HEMATOCRIT: 48.2 % (ref 39.0–52.0)
HEMOGLOBIN: 17 g/dL (ref 13.0–17.0)
Lymphocytes Relative: 27 %
Lymphs Abs: 2.1 10*3/uL (ref 0.7–4.0)
MCH: 31.5 pg (ref 26.0–34.0)
MCHC: 35.3 g/dL (ref 30.0–36.0)
MCV: 89.4 fL (ref 78.0–100.0)
MONOS PCT: 11 %
Monocytes Absolute: 0.9 10*3/uL (ref 0.1–1.0)
NEUTROS PCT: 60 %
Neutro Abs: 4.6 10*3/uL (ref 1.7–7.7)
Platelets: 234 10*3/uL (ref 150–400)
RBC: 5.39 MIL/uL (ref 4.22–5.81)
RDW: 13 % (ref 11.5–15.5)
WBC: 7.8 10*3/uL (ref 4.0–10.5)

## 2018-02-03 LAB — BASIC METABOLIC PANEL
ANION GAP: 8 (ref 5–15)
BUN: 27 mg/dL — AB (ref 6–20)
CO2: 22 mmol/L (ref 22–32)
Calcium: 8.3 mg/dL — ABNORMAL LOW (ref 8.9–10.3)
Chloride: 110 mmol/L (ref 101–111)
Creatinine, Ser: 0.91 mg/dL (ref 0.61–1.24)
GFR calc Af Amer: >60 mL/min (ref >60)
GFR calc non Af Amer: >60 mL/min (ref >60)
GLUCOSE: 103 mg/dL — AB (ref 65–99)
POTASSIUM: 3.8 mmol/L (ref 3.5–5.1)
Sodium: 140 mmol/L (ref 135–145)

## 2018-02-03 MED ORDER — SODIUM CHLORIDE 0.9 % IV BOLUS
1000.0000 mL | Freq: Once | INTRAVENOUS | Status: AC
  Administered 2018-02-03: 1000 mL via INTRAVENOUS

## 2018-02-03 MED ORDER — HYDROMORPHONE HCL 1 MG/ML IJ SOLN
1.0000 mg | Freq: Once | INTRAMUSCULAR | Status: AC
  Administered 2018-02-03: 1 mg via INTRAVENOUS
  Filled 2018-02-03: qty 1

## 2018-02-03 MED ORDER — TETANUS-DIPHTH-ACELL PERTUSSIS 5-2.5-18.5 LF-MCG/0.5 IM SUSP
0.5000 mL | Freq: Once | INTRAMUSCULAR | Status: AC
  Administered 2018-02-03: 0.5 mL via INTRAMUSCULAR
  Filled 2018-02-03: qty 0.5

## 2018-02-03 NOTE — ED Notes (Signed)
Given po fluids 

## 2018-02-03 NOTE — ED Notes (Signed)
ED Provider at bedside. 
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
## 2018-08-04 IMAGING — CR DG FOOT COMPLETE 3+V*R*
3 series · 3 of 3 positions shown · non-contrast
Comparison: None.

CLINICAL DATA: Foot pain medially after rolling foot

EXAM:
RIGHT FOOT COMPLETE - 3+ VIEW

[t foot lat right]
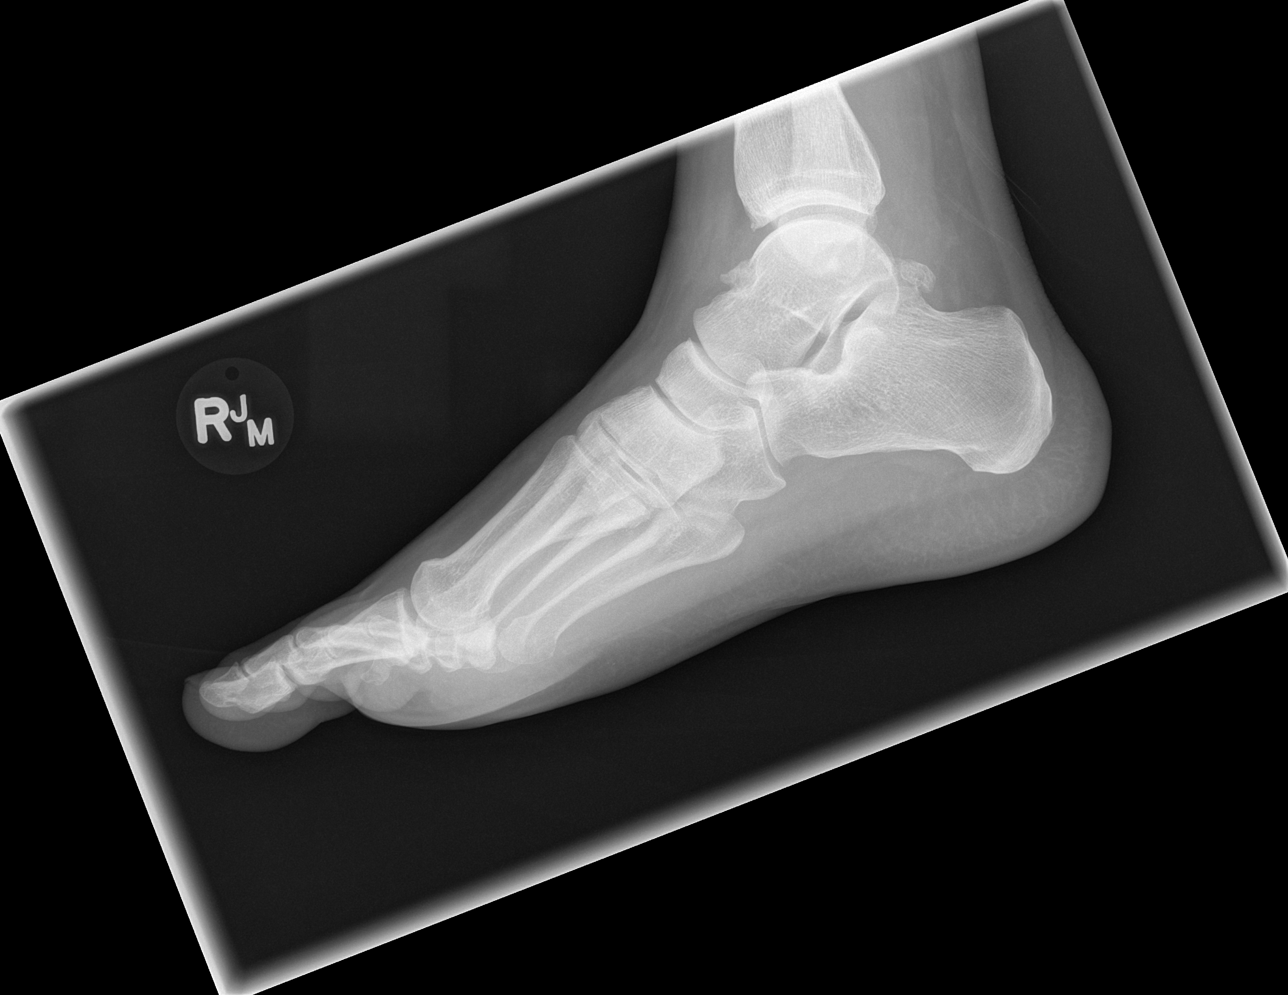

[t foot ap right]
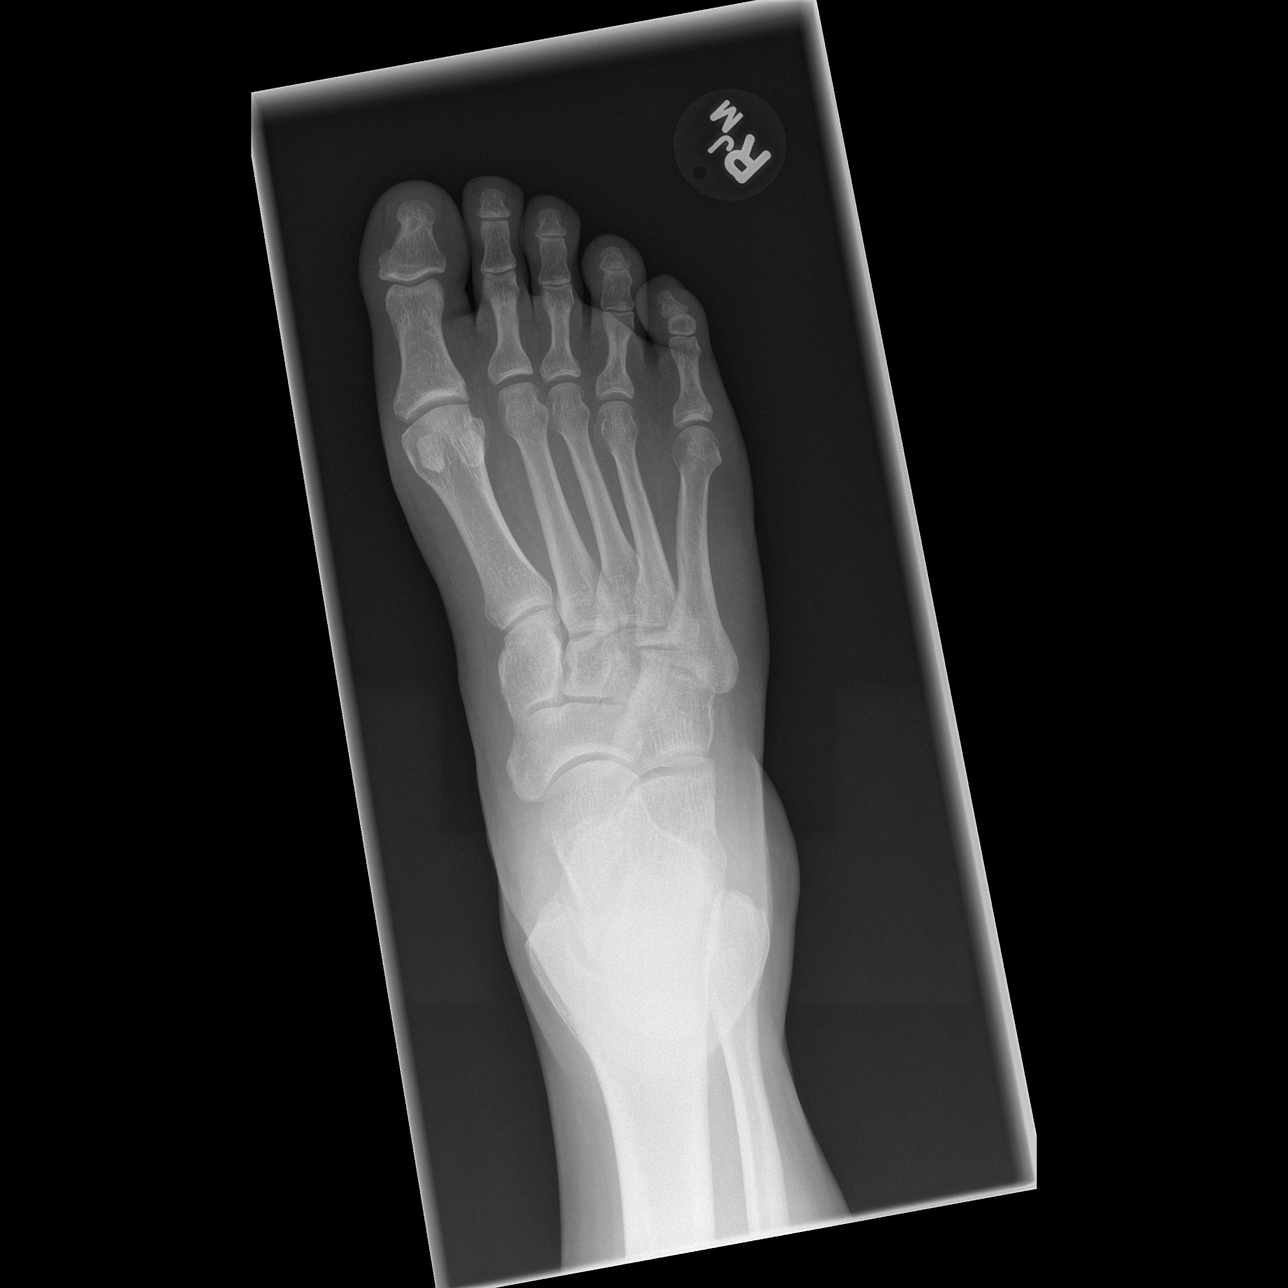

[t foot oblique right]
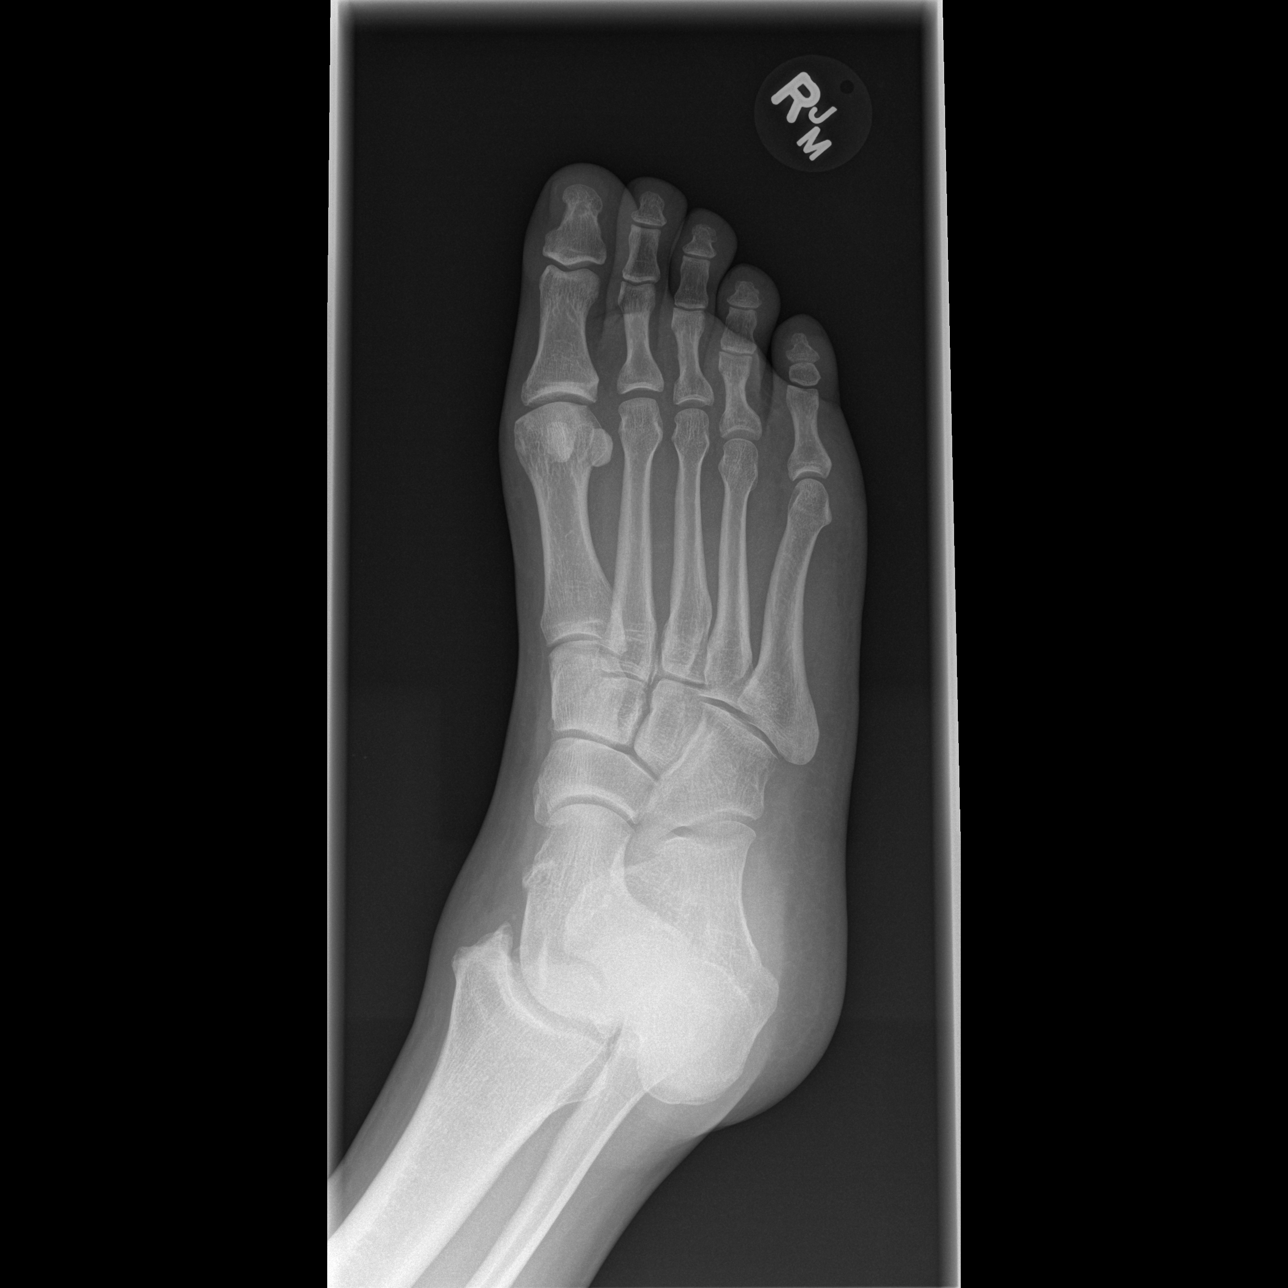

[3 of 3 positions shown; findings below may reference images not displayed]

FINDINGS: No acute fracture is seen. Alignment is normal. There is some
degenerative change from the dorsal aspect of the talus which may be
due to prior trauma.
IMPRESSION: No acute abnormality.

## 2019-02-27 NOTE — Progress Notes (Signed)
Office Visit Note  Patient: Charles Little             Date of Birth: 1965-11-27           MRN: [id number]             PCP: Stephens Shire, MD Referring: Stephens Shire, MD Visit Date: 02/28/2019 Occupation: Owner of Extermin[employer]  Subjective:  Pain of the Right Foot   History of Present Illness: Charles Little is a 53 y.o. male seen in consultation per request of his PCP.  According to patient he has had history of joint pain for several years.  He denies any history of joint swelling.  He states he is an active job and he is on knees and his elbows at the time.  He states he has lateral epicondyle pain on the left side.  He has had off-and-on discomfort in his feet for the last few years.  He states about 10 days ago he started having severe pain and discomfort in his left foot over the lateral aspect.  He went to see Dr. Alfonso Ramus who did x-rays of his foot and which was unremarkable.  He also placed him on prednisone is starting at 50 mg a day with a taper.  He states prednisone improved initially but then the pain recurred.  He has been also taking Percocet prescribed by him.  He is having difficulty walking and continues to have swelling of his left foot.  He states the pain got worse yesterday again.  He did not have any lab work.  He was given the diagnosis of gout.  There is family history of gout in his mother and grandparents.  He states he drinks beer occasionally.  Prior to the onset of the symptoms he had a big meal with lobster, shrimp and fish.  He consumes significant amount of red meat.  He believes that there might be some family history of rheumatoid arthritis as well.  He denies any history of rash or psoriasis.  Activities of Daily Living:  Patient reports morning stiffness for 15 minute.   Patient Denies nocturnal pain.  Difficulty dressing/grooming: Reports Difficulty climbing stairs: Denies Difficulty getting out of chair: Denies Difficulty using hands for taps,  buttons, cutlery, and/or writing: Denies  Review of Systems  Constitutional: Negative for fatigue and night sweats.  HENT: Negative for mouth sores, mouth dryness and nose dryness.   Eyes: Negative for pain, redness and dryness.  Respiratory: Negative for shortness of breath, wheezing and difficulty breathing.   Cardiovascular: Negative for chest pain, palpitations, hypertension, irregular heartbeat and swelling in legs/feet.  Gastrointestinal: Negative for abdominal pain, constipation and diarrhea.  Endocrine: Negative for excessive thirst and increased urination.  Genitourinary: Negative for difficulty urinating.  Musculoskeletal: Positive for arthralgias, joint pain and morning stiffness. Negative for joint swelling, myalgias, muscle weakness, muscle tenderness and myalgias.  Skin: Negative for color change, rash, hair loss, nodules/bumps, redness, skin tightness, ulcers and sensitivity to sunlight.  Allergic/Immunologic: Negative for susceptible to infections.  Neurological: Negative for dizziness, fainting, headaches, memory loss, night sweats and weakness ( ).  Hematological: Negative for bruising/bleeding tendency and swollen glands.  Psychiatric/Behavioral: Negative for depressed mood and sleep disturbance. The patient is not nervous/anxious.     PMFS History:  There are no active problems to display for this patient.   Past Medical History:  Diagnosis Date  . High blood pressure   . Hypertension     Family History  Problem Relation  Age of Onset  . Cancer Father    History reviewed. No pertinent surgical history. Social History   Social History Narrative  . Not on file   Immunization History  Administered Date(s) Administered  . Tdap 02/03/2018     Objective: Vital Signs: BP (!) 153/89 (BP Location: Right Arm, Patient Position: Sitting, Cuff Size: Large)   Pulse 63   Resp 12   Ht 5' 9.5" (1.765 m)   Wt 255 lb (115.7 kg)   BMI 37.12 kg/m    Physical Exam  Vitals signs and nursing note reviewed.  Constitutional:      Appearance: He is well-developed.  HENT:     Head: Normocephalic and atraumatic.  Eyes:     Conjunctiva/sclera: Conjunctivae normal.     Pupils: Pupils are equal, round, and reactive to light.  Neck:     Musculoskeletal: Normal range of motion and neck supple.  Cardiovascular:     Rate and Rhythm: Normal rate and regular rhythm.     Heart sounds: Normal heart sounds.  Pulmonary:     Effort: Pulmonary effort is normal.     Breath sounds: Normal breath sounds.  Abdominal:     General: Bowel sounds are normal.     Palpations: Abdomen is soft.  Skin:    General: Skin is warm and dry.     Capillary Refill: Capillary refill takes less than 2 seconds.  Neurological:     Mental Status: He is alert and oriented to person, place, and time.  Psychiatric:        Behavior: Behavior normal.      Musculoskeletal Exam: C-spine thoracic and lumbar spine good range of motion.  Shoulder joints were in good range of motion.  He had tenderness over left lateral epicondyle area.  He had some DIP and PIP thickening in his hands but no synovitis was noted.  Hip joints were in good range of motion.  He had some crepitus in his knee joints without any warmth swelling or effusion.  He is extremely tender right fifth MTP joint and the surrounding area of the foot.  He had warmth and redness in that area of his foot.  CDAI Exam: CDAI Score: Not documented Patient Global Assessment: Not documented; Provider Global Assessment: Not documented Swollen: Not documented; Tender: Not documented Joint Exam   Not documented   There is currently no information documented on the homunculus. Go to the Rheumatology activity and complete the homunculus joint exam.  Investigation: No additional findings.  Imaging: No results found.  Recent Labs: Lab Results  Component Value Date   WBC 7.8 02/03/2018   HGB 17.0 02/03/2018   PLT 234 02/03/2018   NA  140 02/03/2018   K 3.8 02/03/2018   CL 110 02/03/2018   CO2 22 02/03/2018   GLUCOSE 103 (H) 02/03/2018   BUN 27 (H) 02/03/2018   CREATININE 0.91 02/03/2018   CALCIUM 8.3 (L) 02/03/2018   GFRAA >60 02/03/2018  February 21, 2019 x-ray of the right foot unremarkable.  Speciality Comments: No specialty comments available.  Procedures:  No procedures performed Allergies: Patient has no known allergies.   Assessment / Plan:     Visit Diagnoses: Pain in right foot -patient has severe pain and discomfort in his right foot over the lateral aspect.  Clinical findings are consistent with gout.  He is having difficulty walking.  He had a taper of prednisone and the pain has recurred now.  I will obtain following labs today.  Plan: Sedimentation rate, Uric acid, Urinalysis, Routine w reflex microscopic, Rheumatoid factor, Cyclic citrul peptide antibody, IgG, HLA-B27 antigen.  Detailed counseling guarding gout was provided.  I will give him another prednisone taper and will start him on colchicine 0.6 mg p.o. twice daily if tolerated.  The plan is to add allopurinol 100 mg p.o. daily after 5 days.  We will increase allopurinol by 100 mg every week until he reaches 300 mg p.o. daily.  We will repeat labs again in 1 month.  Prednisone taper was also given for the flare.  Left lateral epicondylitis-related to his work.  Handout on exercises was given.  Chronic pain of both knees-most likely due to osteoarthritis.  He declined x-rays today.  Medication monitoring encounter - Plan: CBC with Differential/Platelet, COMPLETE METABOLIC PANEL WITH GFR  Pain in both hands-he has pain in his both hands but no synovitis was noted.  Family history of gout - Mother and maternal grandparents   Orders: Orders Placed This Encounter  Procedures  . CBC with Differential/Platelet  . COMPLETE METABOLIC PANEL WITH GFR  . Sedimentation rate  . Uric acid  . Urinalysis, Routine w reflex microscopic  . Rheumatoid factor  .  Cyclic citrul peptide antibody, IgG  . HLA-B27 antigen  . CBC with Differential/Platelet  . COMPLETE METABOLIC PANEL WITH GFR  . Uric acid   Meds ordered this encounter  Medications  . allopurinol (ZYLOPRIM) 100 MG tablet    Sig: Take 1 tablet daily for 1 week, then increase to 2 tablets daily for 1 week, then 3 tablets daily  Call office for refills.    Dispense:  63 tablet    Refill:  0  . Colchicine (MITIGARE) 0.6 MG CAPS    Sig: Take 2 capsules by mouth daily. May decrease to 1 capsule for GI upset.    Dispense:  60 capsule    Refill:  2  . predniSONE (DELTASONE) 5 MG tablet    Sig: Take 4 tablets (20 mg total) by mouth daily for 4 days, THEN 3 tablets (15 mg total) daily for 4 days, THEN 2 tablets (10 mg total) daily for 4 days, THEN 1 tablet (5 mg total) daily for 4 days, THEN 0.5 tablets (2.5 mg total) daily for 4 days.    Dispense:  42 tablet    Refill:  0    Face-to-face time spent with patient was 45 minutes. Greater than 50% of time was spent in counseling and coordination of care.  Follow-Up Instructions: Return in about 4 weeks (around 03/28/2019) for Arthralgia, Gout.   Bo Merino, MD  Note - This record has been created using Editor, commissioning.  Chart creation errors have been sought, but may not always  have been located. Such creation errors do not reflect on  the standard of medical care.

## 2019-02-28 ENCOUNTER — Ambulatory Visit (INDEPENDENT_AMBULATORY_CARE_PROVIDER_SITE_OTHER): Payer: BC Managed Care – PPO | Admitting: Rheumatology

## 2019-02-28 ENCOUNTER — Encounter: Payer: Self-pay | Admitting: Rheumatology

## 2019-02-28 ENCOUNTER — Other Ambulatory Visit: Payer: Self-pay

## 2019-02-28 VITALS — BP 153/89 | HR 63 | Resp 12 | Ht 69.5 in | Wt 255.0 lb

## 2019-02-28 DIAGNOSIS — M25561 Pain in right knee: Secondary | ICD-10-CM

## 2019-02-28 DIAGNOSIS — M79641 Pain in right hand: Secondary | ICD-10-CM

## 2019-02-28 DIAGNOSIS — M25562 Pain in left knee: Secondary | ICD-10-CM

## 2019-02-28 DIAGNOSIS — M79642 Pain in left hand: Secondary | ICD-10-CM

## 2019-02-28 DIAGNOSIS — G8929 Other chronic pain: Secondary | ICD-10-CM

## 2019-02-28 DIAGNOSIS — M79671 Pain in right foot: Secondary | ICD-10-CM

## 2019-02-28 DIAGNOSIS — M7712 Lateral epicondylitis, left elbow: Secondary | ICD-10-CM | POA: Diagnosis not present

## 2019-02-28 DIAGNOSIS — Z8269 Family history of other diseases of the musculoskeletal system and connective tissue: Secondary | ICD-10-CM

## 2019-02-28 DIAGNOSIS — Z5181 Encounter for therapeutic drug level monitoring: Secondary | ICD-10-CM

## 2019-02-28 MED ORDER — ALLOPURINOL 100 MG PO TABS: ORAL_TABLET | ORAL | 0 refills | Status: DC

## 2019-02-28 MED ORDER — COLCHICINE 0.6 MG PO CAPS: 2.0000 | ORAL_CAPSULE | Freq: Every day | ORAL | 2 refills | Status: DC

## 2019-02-28 MED ORDER — PREDNISONE 5 MG PO TABS: ORAL_TABLET | ORAL | 0 refills | Status: AC

## 2019-02-28 NOTE — Patient Instructions (Addendum)
Start allopurinol 5 days after starting colchicine  Standing Labs We placed an order today for your standing lab work.    Please come back and get your standing labs in 1 month.  We have open lab daily Monday through Thursday from 8:30-12:30 PM and 1:30-4:30 PM and Friday from 8:30-12:30 PM and 1:30 -4:00 PM at the office of Dr. Pollyann SavoyShaili Deveshwar.   You may experience shorter wait times on Monday and Friday afternoons. The office is located at [address], [address], [place], [place] [address] No appointment is necessary.   Labs are drawn by [facility].  You may receive a bill from [facility] for your lab work.  If you wish to have your labs drawn at another location, please call the office 24 hours in advance to send orders.  If you have any questions regarding directions or hours of operation,  please call [phone].   Just as a reminder please drink plenty of water prior to coming for your lab work. Thanks!  Allopurinol tablets What is this medicine? ALLOPURINOL (al oh PURE i nole) reduces the amount of uric acid the body makes. It is used to treat the symptoms of gout. It is also used to treat or prevent high uric acid levels that occur as a result of certain types of chemotherapy. This medicine may also help patients who frequently have kidney stones. This medicine may be used for other purposes; ask your health care provider or pharmacist if you have questions. COMMON BRAND NAME(S): Zyloprim What should I tell my health care provider before I take this medicine? They need to know if you have any of these conditions: -kidney disease -liver disease -an unusual or allergic reaction to allopurinol, other medicines, foods, dyes, or preservatives -pregnant or trying to get pregnant -breast feeding How should I use this medicine? Take this medicine by mouth with a glass of water. Follow the directions on the prescription label. If this medicine upsets your stomach, take it with food or  milk. Take your doses at regular intervals. Do not take your medicine more often than directed. Talk to your pediatrician regarding the use of this medicine in children. Special care may be needed. While this drug may be prescribed for children as young as 6 years for selected conditions, precautions do apply. Overdosage: If you think you have taken too much of this medicine contact a poison control center or emergency room at once. NOTE: This medicine is only for you. Do not share this medicine with others. What if I miss a dose? If you miss a dose, take it as soon as you can. If it is almost time for your next dose, take only that dose. Do not take double or extra doses. What may interact with this medicine? Do not take this medicine with the following medication: -didanosine, ddI This medicine may also interact with the following medications: -certain antibiotics like amoxicillin, ampicillin -certain medicines for cancer -certain medicines for immunosuppression like azathioprine, cyclosporine, mercaptopurine -chlorpropamide -probenecid -thiazide diuretics, like hydrochlorothiazide -sulfinpyrazone -warfarin This list may not describe all possible interactions. Give your health care provider a list of all the medicines, herbs, non-prescription drugs, or dietary supplements you use. Also tell them if you smoke, drink alcohol, or use illegal drugs. Some items may interact with your medicine. What should I watch for while using this medicine? Visit your doctor or health care professional for regular checks on your progress. If you are taking this medicine to treat gout, you may not  have less frequent attacks at first. Keep taking your medicine regularly and the attacks should get better within 2 to 6 weeks. Drink plenty of water (10 to 12 full glasses a day) while you are taking this medicine. This will help to reduce stomach upset and reduce the risk of getting gout or kidney stones. Call your  doctor or health care professional at once if you get a skin rash together with chills, fever, sore throat, or nausea and vomiting, if you have blood in your urine, or difficulty passing urine. Do not take vitamin C without asking your doctor or health care professional. Too much vitamin C can increase the chance of getting kidney stones. You may get drowsy or dizzy. Do not drive, use machinery, or do anything that needs mental alertness until you know how this drug affects you. Do not stand or sit up quickly, especially if you are an older patient. This reduces the risk of dizzy or fainting spells. Alcohol can make you more drowsy and dizzy. Alcohol can also increase the chance of stomach problems and increase the amount of uric acid in your blood. Avoid alcoholic drinks. What side effects may I notice from receiving this medicine? Side effects that you should report to your doctor or health care professional as soon as possible: -allergic reactions like skin rash, itching or hives, swelling of the face, lips, or tongue -breathing problems -fever with rash, swollen lymph nodes, or swelling of the face -joint pain -muscle pain -redness, blistering, peeling or loosening of the skin, including inside the mouth -signs and symptoms of infection like fever or chills; cough; sore throat -signs and symptoms of kidney injury like trouble passing urine or change in the amount of urine, flank pain -tingling, numbness in the hands or feet -unusual bleeding or bruising -unusually weak or tired Side effects that usually do not require medical attention (report to your doctor or health care professional if they continue or are bothersome): -changes in taste -diarrhea -drowsiness -headache -nausea, vomiting -stomach upset This list may not describe all possible side effects. Call your doctor for medical advice about side effects. You may report side effects to FDA at 1-800-FDA-1088. Where should I keep my  medicine? Keep out of the reach of children. Store at room temperature between 15 and 25 degrees C (59 and 77 degrees F). Protect from light and moisture. Throw away any unused medicine after the expiration date. NOTE: This sheet is a summary. It may not cover all possible information. If you have questions about this medicine, talk to your doctor, pharmacist, or health care provider.  2019 Elsevier/Gold Standard (2017-02-09 15:49:12)

## 2019-02-28 NOTE — Progress Notes (Signed)
Pharmacy Note   Subjective:  Patient presents today to the [facility] to see Dr. Estanislado Pandy.  Patient was prescribed allopurinol for gout.  Patient was seen by the pharmacist for counseling on allopurinol and colchicine.   Objective: CBC    Component Value Date/Time   WBC 7.8 02/03/2018 0924   RBC 5.39 02/03/2018 0924   HGB 17.0 02/03/2018 0924   HCT 48.2 02/03/2018 0924   PLT 234 02/03/2018 0924   MCV 89.4 02/03/2018 0924   MCH 31.5 02/03/2018 0924   MCHC 35.3 02/03/2018 0924   RDW 13.0 02/03/2018 0924   LYMPHSABS 2.1 02/03/2018 0924   MONOABS 0.9 02/03/2018 0924   EOSABS 0.1 02/03/2018 0924   BASOSABS 0.0 02/03/2018 0924    CMP     Component Value Date/Time   NA 140 02/03/2018 1000   K 3.8 02/03/2018 1000   CL 110 02/03/2018 1000   CO2 22 02/03/2018 1000   GLUCOSE 103 (H) 02/03/2018 1000   BUN 27 (H) 02/03/2018 1000   CREATININE 0.91 02/03/2018 1000   CALCIUM 8.3 (L) 02/03/2018 1000   GFRNONAA >60 02/03/2018 1000   GFRAA >60 02/03/2018 1000    Uric Acid No results found for: LABURIC    Assessment/Plan:  Counseled patient on the purpose proper use, and adverse effects of allopurinol and colchicine.  Discussed the importance of taking allopurinol every day to lower uric acid levels.  The possibility of recurrent gout while lowering the uric acid was explained and discussed the importance of taking colchicine daily at this time.  Provided patient with medication education material and answered all questions.    Instructed patient to take colchicine 0.6 mg capsules 2 capsules daily if tolerated.  If he develops GI upset he may decrease to 1 capsule daily.  Instructed patient to start allopurinol 5 days after starting colchicine.  His dose of allopurinol will be 100 mg daily for 1 week, then 200 mg daily for 1 week, then 300 mg daily. He is to call when he is done with the allopurinol prescription in order to call in a new order for 300 mg tablets daily to  minimize pill burden.  He was given co-pay card for colchicine if co-pay is expensive.  He is to return in 1 month for labs and instructions for standing orders given.  He was also given a prednisone taper which he has taken before.  He said it kept him up all night.  Instructed him to take with breakfast to help him sleep and avoid GI upset.  Patient verbalized understanding.  All questions encouraged and answered.  Instructed patient to call with any further questions or concerns.  Mariella Saa, PharmD, [facility] Clinical Pharmacist  02/28/2019 10:10 AM

## 2019-03-01 LAB — URINALYSIS, ROUTINE W REFLEX MICROSCOPIC
Bilirubin Urine: NEGATIVE
Glucose, UA: NEGATIVE
Hgb urine dipstick: NEGATIVE
Ketones, ur: NEGATIVE
Leukocytes,Ua: NEGATIVE
Nitrite: NEGATIVE
Protein, ur: NEGATIVE
Specific Gravity, Urine: 1.03 (ref 1.001–1.03)
pH: <=5 (ref 5.0–8.0)

## 2019-03-23 ENCOUNTER — Other Ambulatory Visit: Payer: Self-pay | Admitting: Rheumatology

## 2019-03-23 DIAGNOSIS — M79671 Pain in right foot: Secondary | ICD-10-CM

## 2019-03-27 NOTE — Telephone Encounter (Addendum)
Last Visit: 02/28/19 Next Visit: 03/30/19 Labs: no cbc/cmp   Attempted to contact the patient and left message for patient to call the office. Patient needs a follow up appointment as well as labs.

## 2019-03-29 NOTE — Progress Notes (Signed)
Office Visit Note  Patient: Charles Little             Date of Birth: [birth year]           MRN: [id number]             PCP: Stephens Shire, MD Referring: Stephens Shire, MD Visit Date: 03/30/2019 Occupation: _0 @  Subjective:  Pain in multiple joints.   History of Present Illness: Charles Little is a 53 y.o. male history of pain in multiple joints.  He states since the last visit he has had episodes of right first MTP pain.  He also describes pain in his right elbow and his hands.  He denies any joint swelling.  He notices increasing stiffness in his hands after activities.  Not get his blood drawn during the last visit.  Activities of Daily Living:  Patient reports morning stiffness for 5 minutes.   Patient Reports nocturnal pain.  Difficulty dressing/grooming: Denies Difficulty climbing stairs: Denies Difficulty getting out of chair: Denies Difficulty using hands for taps, buttons, cutlery, and/or writing: Denies  Review of Systems  Constitutional: Negative for fatigue.  HENT: Negative for mouth sores, mouth dryness and nose dryness.   Eyes: Negative for itching and dryness.  Respiratory: Negative for shortness of breath, wheezing and difficulty breathing.   Cardiovascular: Negative for chest pain, palpitations and swelling in legs/feet.  Gastrointestinal: Negative for abdominal pain, constipation and diarrhea.  Endocrine: Negative for increased urination.  Genitourinary: Negative for painful urination and pelvic pain.  Musculoskeletal: Positive for arthralgias, joint pain and morning stiffness. Negative for joint swelling.  Skin: Negative for rash and redness.  Allergic/Immunologic: Negative for susceptible to infections.  Neurological: Negative for dizziness, fainting, headaches, memory loss and weakness.  Hematological: Negative for bruising/bleeding tendency.  Psychiatric/Behavioral: Negative for confusion. The patient is not nervous/anxious.     PMFS  History:  There are no active problems to display for this patient.   Past Medical History:  Diagnosis Date  . High blood pressure   . Hypertension     Family History  Problem Relation Age of Onset  . Cancer Father    History reviewed. No pertinent surgical history. Social History   Social History Narrative  . Not on file   Immunization History  Administered Date(s) Administered  . Tdap 02/03/2018     Objective: Vital Signs: BP (!) 168/103 (BP Location: Left Arm, Patient Position: Sitting, Cuff Size: Normal)   Pulse 62   Resp 14   Ht _1  (1.778 m)   Wt 254 lb (115.2 kg)   BMI 36.45 kg/m    Physical Exam Vitals signs and nursing note reviewed.  Constitutional:      Appearance: He is well-developed.  HENT:     Head: Normocephalic and atraumatic.  Eyes:     Conjunctiva/sclera: Conjunctivae normal.     Pupils: Pupils are equal, round, and reactive to light.  Neck:     Musculoskeletal: Normal range of motion and neck supple.  Cardiovascular:     Rate and Rhythm: Normal rate and regular rhythm.     Heart sounds: Normal heart sounds.  Pulmonary:     Effort: Pulmonary effort is normal.     Breath sounds: Normal breath sounds.  Abdominal:     General: Bowel sounds are normal.     Palpations: Abdomen is soft.  Skin:    General: Skin is warm and dry.     Capillary Refill: Capillary refill takes less than 2  seconds.  Neurological:     Mental Status: He is alert and oriented to person, place, and time.  Psychiatric:        Behavior: Behavior normal.      Musculoskeletal Exam: Shoulder joints elbow joints with good range of motion.  He has DIP and PIP thickening bilaterally with no synovitis.  He had tenderness on palpation over right lateral epicondyle area consistent with lateral epicondylitis.  Hip joints with good range of motion.  He has crepitus with range of motion of bilateral knee joints without any warmth swelling or effusion.  He has mild tenderness over  right first MTP without any swelling.  CDAI Exam: CDAI Score: - Patient Global: -; Provider Global: - Swollen: -; Tender: - Joint Exam   No joint exam has been documented for this visit   There is currently no information documented on the homunculus. Go to the Rheumatology activity and complete the homunculus joint exam.  Investigation: No additional findings.  Imaging: No results found.  Recent Labs: Lab Results  Component Value Date   WBC 7.8 02/03/2018   HGB 17.0 02/03/2018   PLT 234 02/03/2018   NA 140 02/03/2018   K 3.8 02/03/2018   CL 110 02/03/2018   CO2 22 02/03/2018   GLUCOSE 103 (H) 02/03/2018   BUN 27 (H) 02/03/2018   CREATININE 0.91 02/03/2018   CALCIUM 8.3 (L) 02/03/2018   GFRAA >60 02/03/2018  February 28, 2019 UA negative  Speciality Comments: No specialty comments available.  Procedures:  No procedures performed Allergies: Patient has no known allergies.   Assessment / Plan:     Visit Diagnoses: Pain in right foot - Labs ordered last visit included CBC, CMP, RF, anti-CCP, HLA-B27, uric acid.  The plan was to start on allopurinol and colchicine after the labs.  Patient will get labs drawn today.  He states he has had intermittent discomfort in his right first MTP joint and his left first MCP joint.  No synovitis was noted today.  Right lateral epicondylitis -pain and tenderness over right lateral epicondyle area.  Have given her prescription for tennis elbow brace.  Also discussed the use of topical Voltaren gel.  Chronic pain of both knees - Patient declined x-rays.  Weight loss diet and exercise was discussed.  Have given a handout on knee exercises.  He may use Voltaren gel on his knees.  Primary osteoarthritis of both hands -clinical findings are consistent with osteoarthritis.  Joint protection was discussed.  Family history of gout   Orders: No orders of the defined types were placed in this encounter.  No orders of the defined types were placed  in this encounter.    Follow-Up Instructions: Return in about 4 weeks (around 04/27/2019) for Osteoarthritis.   Bo Merino, MD  Note - This record has been created using Editor, commissioning.  Chart creation errors have been sought, but may not always  have been located. Such creation errors do not reflect on  the standard of medical care.

## 2019-03-30 ENCOUNTER — Other Ambulatory Visit: Payer: Self-pay

## 2019-03-30 ENCOUNTER — Encounter: Payer: Self-pay | Admitting: Rheumatology

## 2019-03-30 ENCOUNTER — Ambulatory Visit: Payer: BC Managed Care – PPO | Admitting: Rheumatology

## 2019-03-30 VITALS — BP 168/103 | HR 62 | Resp 14 | Ht 70.0 in | Wt 254.0 lb

## 2019-03-30 DIAGNOSIS — M19041 Primary osteoarthritis, right hand: Secondary | ICD-10-CM

## 2019-03-30 DIAGNOSIS — M7711 Lateral epicondylitis, right elbow: Secondary | ICD-10-CM

## 2019-03-30 DIAGNOSIS — M19042 Primary osteoarthritis, left hand: Secondary | ICD-10-CM

## 2019-03-30 DIAGNOSIS — M25561 Pain in right knee: Secondary | ICD-10-CM

## 2019-03-30 DIAGNOSIS — G8929 Other chronic pain: Secondary | ICD-10-CM

## 2019-03-30 DIAGNOSIS — M79671 Pain in right foot: Secondary | ICD-10-CM | POA: Diagnosis not present

## 2019-03-30 DIAGNOSIS — Z8269 Family history of other diseases of the musculoskeletal system and connective tissue: Secondary | ICD-10-CM

## 2019-03-30 DIAGNOSIS — M25562 Pain in left knee: Secondary | ICD-10-CM

## 2019-03-30 NOTE — Patient Instructions (Signed)
Journal for Nurse Practitioners, 15(4), 263-267. Retrieved June 27, 2018 from [email or web address].[email or web address]">  Knee Exercises Ask your health care provider which exercises are safe for you. Do exercises exactly as told by your health care provider and adjust them as directed. It is normal to feel mild stretching, pulling, tightness, or discomfort as you do these exercises. Stop right away if you feel sudden pain or your pain gets worse. Do not begin these exercises until told by your health care provider. Stretching and range-of-motion exercises These exercises warm up your muscles and joints and improve the movement and flexibility of your knee. These exercises also help to relieve pain and swelling. Knee extension, prone 1. Lie on your abdomen (prone position) on a bed. 2. Place your left / right knee just beyond the edge of the surface so your knee is not on the bed. You can put a towel under your left / right thigh just above your kneecap for comfort. 3. Relax your leg muscles and allow gravity to straighten your knee (extension). You should feel a stretch behind your left / right knee. 4. Hold this position for __________ seconds. 5. Scoot up so your knee is supported between repetitions. Repeat __________ times. Complete this exercise __________ times a day. Knee flexion, active  1. Lie on your back with both legs straight. If this causes back discomfort, bend your left / right knee so your foot is flat on the floor. 2. Slowly slide your left / right heel back toward your buttocks. Stop when you feel a gentle stretch in the front of your knee or thigh (flexion). 3. Hold this position for __________ seconds. 4. Slowly slide your left / right heel back to the starting position. Repeat __________ times. Complete this exercise __________ times a day. Quadriceps stretch, prone  1. Lie on your abdomen on a firm surface, such as a bed or padded floor. 2. Bend your left / right knee and hold  your ankle. If you cannot reach your ankle or pant leg, loop a belt around your foot and grab the belt instead. 3. Gently pull your heel toward your buttocks. Your knee should not slide out to the side. You should feel a stretch in the front of your thigh and knee (quadriceps). 4. Hold this position for __________ seconds. Repeat __________ times. Complete this exercise __________ times a day. Hamstring, supine 1. Lie on your back (supine position). 2. Loop a belt or towel over the ball of your left / right foot. The ball of your foot is on the walking surface, right under your toes. 3. Straighten your left / right knee and slowly pull on the belt to raise your leg until you feel a gentle stretch behind your knee (hamstring). ? Do not let your knee bend while you do this. ? Keep your other leg flat on the floor. 4. Hold this position for __________ seconds. Repeat __________ times. Complete this exercise __________ times a day. Strengthening exercises These exercises build strength and endurance in your knee. Endurance is the ability to use your muscles for a long time, even after they get tired. Quadriceps, isometric This exercise stretches the muscles in front of your thigh (quadriceps) without moving your knee joint (isometric). 1. Lie on your back with your left / right leg extended and your other knee bent. Put a rolled towel or small pillow under your knee if told by your health care provider. 2. Slowly tense the muscles in the front of your left /   right thigh. You should see your kneecap slide up toward your hip or see increased dimpling just above the knee. This motion will push the back of the knee toward the floor. 3. For __________ seconds, hold the muscle as tight as you can without increasing your pain. 4. Relax the muscles slowly and completely. Repeat __________ times. Complete this exercise __________ times a day. Straight leg raises This exercise stretches the muscles in front  of your thigh (quadriceps) and the muscles that move your hips (hip flexors). 1. Lie on your back with your left / right leg extended and your other knee bent. 2. Tense the muscles in the front of your left / right thigh. You should see your kneecap slide up or see increased dimpling just above the knee. Your thigh may even shake a bit. 3. Keep these muscles tight as you raise your leg 4-6 inches (10-15 cm) off the floor. Do not let your knee bend. 4. Hold this position for __________ seconds. 5. Keep these muscles tense as you lower your leg. 6. Relax your muscles slowly and completely after each repetition. Repeat __________ times. Complete this exercise __________ times a day. Hamstring, isometric 1. Lie on your back on a firm surface. 2. Bend your left / right knee about __________ degrees. 3. Dig your left / right heel into the surface as if you are trying to pull it toward your buttocks. Tighten the muscles in the back of your thighs (hamstring) to "dig" as hard as you can without increasing any pain. 4. Hold this position for __________ seconds. 5. Release the tension gradually and allow your muscles to relax completely for __________ seconds after each repetition. Repeat __________ times. Complete this exercise __________ times a day. Hamstring curls If told by your health care provider, do this exercise while wearing ankle weights. Begin with __________ lb weights. Then increase the weight by 1 lb (0.5 kg) increments. Do not wear ankle weights that are more than __________ lb. 1. Lie on your abdomen with your legs straight. 2. Bend your left / right knee as far as you can without feeling pain. Keep your hips flat against the floor. 3. Hold this position for __________ seconds. 4. Slowly lower your leg to the starting position. Repeat __________ times. Complete this exercise __________ times a day. Squats This exercise strengthens the muscles in front of your thigh and knee  (quadriceps). 1. Stand in front of a table, with your feet and knees pointing straight ahead. You may rest your hands on the table for balance but not for support. 2. Slowly bend your knees and lower your hips like you are going to sit in a chair. ? Keep your weight over your heels, not over your toes. ? Keep your lower legs upright so they are parallel with the table legs. ? Do not let your hips go lower than your knees. ? Do not bend lower than told by your health care provider. ? If your knee pain increases, do not bend as low. 3. Hold the squat position for __________ seconds. 4. Slowly push with your legs to return to standing. Do not use your hands to pull yourself to standing. Repeat __________ times. Complete this exercise __________ times a day. Wall slides This exercise strengthens the muscles in front of your thigh and knee (quadriceps). 1. Lean your back against a smooth wall or door, and walk your feet out 18-24 inches (46-61 cm) from it. 2. Place your feet hip-width apart. 3.   Slowly slide down the wall or door until your knees bend __________ degrees. Keep your knees over your heels, not over your toes. Keep your knees in line with your hips. 4. Hold this position for __________ seconds. Repeat __________ times. Complete this exercise __________ times a day. Straight leg raises This exercise strengthens the muscles that rotate the leg at the hip and move it away from your body (hip abductors). 1. Lie on your side with your left / right leg in the top position. Lie so your head, shoulder, knee, and hip line up. You may bend your bottom knee to help you keep your balance. 2. Roll your hips slightly forward so your hips are stacked directly over each other and your left / right knee is facing forward. 3. Leading with your heel, lift your top leg 4-6 inches (10-15 cm). You should feel the muscles in your outer hip lifting. ? Do not let your foot drift forward. ? Do not let your knee  roll toward the ceiling. 4. Hold this position for __________ seconds. 5. Slowly return your leg to the starting position. 6. Let your muscles relax completely after each repetition. Repeat __________ times. Complete this exercise __________ times a day. Straight leg raises This exercise stretches the muscles that move your hips away from the front of the pelvis (hip extensors). 1. Lie on your abdomen on a firm surface. You can put a pillow under your hips if that is more comfortable. 2. Tense the muscles in your buttocks and lift your left / right leg about 4-6 inches (10-15 cm). Keep your knee straight as you lift your leg. 3. Hold this position for __________ seconds. 4. Slowly lower your leg to the starting position. 5. Let your leg relax completely after each repetition. Repeat __________ times. Complete this exercise __________ times a day. This information is not intended to replace advice given to you by your health care provider. Make sure you discuss any questions you have with your health care provider. Document Released: 07/22/2005 Document Revised: 06/28/2018 Document Reviewed: 06/28/2018 Elsevier Patient Education  2020 [employer] Elbow and Forearm Exercises Ask your health care provider which exercises are safe for you. Do exercises exactly as told by your health care provider and adjust them as directed. It is normal to feel mild stretching, pulling, tightness, or discomfort as you do these exercises. Stop right away if you feel sudden pain or your pain gets worse. Do not begin these exercises until told by your health care provider. Range-of-motion exercises These exercises warm up your muscles and joints and improve the movement and flexibility of your injured elbow and forearm. The exercises also help to relieve pain, numbness, and tingling. These exercises are done using the muscles in your injured elbow and forearm (active). Elbow flexion, active 1. Hold your left /  right arm at your side, and bend your elbow (flexion) as far as you can using only your arm muscles. 2. Hold this position for __________ seconds. 3. Slowly return to the starting position. Repeat __________ times. Complete this exercise __________ times a day. Elbow extension, active 1. Hold your left / right arm at your side, and straighten your elbow (extension) as much as you can using only your arm muscles. 2. Hold this position for __________ seconds. 3. Slowly return to the starting position. Repeat __________ times. Complete this exercise __________ times a day. Active forearm rotation, supination This is an exercise in which you turn (rotate) your forearm palm up (  supination). 1. Stand or sit with your elbows at your sides. 2. Bend your left / right elbow to a 90-degree angle (right angle). 3. Rotate your palm up until you feel a gentle stretch on the inside of your forearm. 4. Hold this position for __________ seconds. 5. Slowly return to the starting position. Repeat __________ times. Complete this exercise __________ times a day. Active forearm rotation, pronation This is an exercise in which you turn (rotate) your forearm palm down (pronation). 1. Stand or sit with your elbows at your sides. 2. Bend your left / right elbow to a 90-degree angle (right angle). 3. Rotate your palm down until you feel a gentle stretch on the top of your forearm. 4. Hold this position for __________ seconds. 5. Slowly return to the starting position. Repeat __________ times. Complete this exercise __________ times a day. Stretching exercises These exercises warm up your muscles and joints and improve the movement and flexibility of your injured elbow and forearm. These exercises also help to relieve pain, numbness, and tingling. These exercises are done using your healthy elbow and forearm to help stretch the muscles in your injured elbow and forearm (active-assisted). Elbow flexion,  active-assisted  1. Hold your left / right arm at your side, and bend your elbow (flexion) as much as you can using your left / right arm muscles. 2. Use your other hand to bend your left / right elbow farther. To do this, gently push up on your forearm until you feel a gentle stretch on the back of your elbow. 3. Hold this position for __________ seconds. 4. Slowly return to the starting position. Repeat __________ times. Complete this exercise __________ times a day. Elbow extension, active-assisted  1. Hold your left / right arm at your side, and straighten your elbow (extension) as much as you can using your left / right arm muscles. 2. Use your other hand to straighten the left / right elbow farther. To do this, gently push down on your forearm until you feel a gentle stretch on the inside of your elbow. 3. Hold this position for __________ seconds. 4. Slowly return to the starting position. Repeat __________ times. Complete this exercise __________ times a day. Active-assisted forearm rotation, supination This is an exercise in which you turn (rotate) your forearm palm up (supination). 1. Sit with your left / right elbow bent in a 90-degree angle (right angle) with your forearm resting on a table. 2. Keeping your upper body and shoulder still, rotate your forearm so your palm faces upward. 3. Use your other hand to help rotate your forearm further until you feel a gentle to moderate stretch. 4. Hold this position for __________ seconds. 5. Slowly release the stretch and return to the starting position. Repeat __________ times. Complete this exercise __________ times a day. Active-assisted forearm rotation, pronation This is an exercise in which you turn (rotate) your forearm palm down (pronation). 1. Sit with your left / right elbow bent in a 90-degree angle (right angle) with your forearm resting on a table. 2. Keeping your upper body and shoulder still, rotate your forearm so your  palm faces the tabletop. 3. Use your other hand to help rotate your forearm further until you feel a gentle to moderate stretch. 4. Hold this position for __________ seconds. 5. Slowly release the stretch and return to the starting position. Repeat __________ times. Complete this exercise __________ times a day. Passive elbow flexion, supine 1. Lie on your back (supine position). 2.  Extend your left / right arm up in the air, bracing it with your other hand. 3. Let your left / right hand slowly lower toward your shoulder (passive flexion), while your elbow stays pointed toward the ceiling. You should feel a gentle stretch along the back of your upper arm and elbow. 4. If instructed by your health care provider, you may increase the intensity of your stretch by adding a small wrist weight or hand weight. 5. Hold this position for __________ seconds. 6. Slowly return to the starting position. Repeat __________ times. Complete this exercise __________ times a day. Passive elbow extension, supine  1. Lie on your back (supine position). Make sure that you are in a comfortable position that lets you relax your arm muscles. 2. Place a folded towel under your left / right upper arm so your elbow and shoulder are at the same height. Straighten your left / right arm so your elbow does not rest on the bed or towel. 3. Let the weight of your hand stretch your elbow (passive extension). Keep your arm and chest muscles relaxed. You should feel a stretch on the inside of your elbow. 4. If told by your health care provider, you may increase the intensity of your stretch by adding a small wrist weight or hand weight. 5. Hold this position for __________ seconds. 6. Slowly release the stretch. Repeat __________ times. Complete this exercise __________ times a day. Strengthening exercises These exercises build strength and endurance in your elbow and forearm. Endurance is the ability to use your muscles for a  long time, even after they get tired. Elbow flexion, isometric  1. Stand or sit up straight. 2. Bend your left / right elbow in a 90-degree angle (right angle), and keep your forearm at the height of your waist. Your thumb should be pointed toward the ceiling (neutral forearm). 3. Place your other hand on top of your left / right forearm. Gently push down while you resist with your left / right arm (isometric flexion). Push as hard as you can with both arms without causing any pain or movement at your left / right elbow. 4. Hold this position for __________ seconds. 5. Slowly release the tension in both arms. Let your muscles relax completely before you repeat the exercise. Repeat __________ times. Complete this exercise __________ times a day. Elbow extension, isometric  1. Stand or sit up straight. 2. Place your left / right arm so your palm faces your abdomen and is at the height of your waist. 3. Place your other hand on the underside of your left / right forearm. Gently push up while you resist with your left / right arm (isometric extension). Push as hard as you can with both arms without causing any pain or movement at your left / right elbow. 4. Hold this position for __________ seconds. 5. Slowly release the tension in both arms. Let your muscles relax completely before you repeat the exercise. Repeat __________ times. Complete this exercise __________ times a day. Elbow flexion with forearm palm up  1. Sit on a firm chair without armrests, or stand up. 2. Place your left / right arm at your side with your elbow straight and your palm facing forward. 3. Holding a __________weight or gripping a rubber exercise band or tubing, bend your elbow to bring your hand toward your shoulder (flexion). 4. Hold this position for __________ seconds. 5. Slowly return to the starting position. Repeat __________ times. Complete this exercise __________ times  a day. Elbow extension, active  1. Sit  on a firm chair without armrests, or stand up. 2. Hold a rubber exercise band or tubing in both hands. 3. Keeping your upper arms at your sides, bring both hands up to your left / right shoulder. Keep your left / right hand just below your other hand. 4. Straighten your left / right elbow (extension) while keeping your other arm still. 5. Hold this position for __________ seconds. 6. Control the resistance of the band or tubing as you return to the starting position. Repeat __________ times. Complete this exercise __________ times a day. Forearm rotation, supination  1. Sit with your left / right forearm supported on a table. Your elbow should be at waist height and bent at a 90-degree angle (right angle). 2. Gently grasp a lightweight hammer. 3. Rest your hand over the edge of the table with your palm facing down. 4. Without moving your left / right elbow, slowly rotate your forearm to turn your palm up toward the ceiling (supination). 5. Hold this position for __________ seconds. 6. Slowly return to the starting position. Repeat __________ times. Complete this exercise __________ times a day. Forearm rotation, pronation  1. Sit with your left / right forearm supported on a table. Keep your elbow below shoulder height. 2. Gently grasp a lightweight hammer. 3. Rest your hand over the edge of the table with your palm facing up. 4. Without moving your left / right elbow, slowly rotate your forearm to turn your palm down toward the floor (pronation). 5. Hold this position for __________seconds. 6. Slowly return to the starting position. Repeat __________ times. Complete this exercise __________ times a day. This information is not intended to replace advice given to you by your health care provider. Make sure you discuss any questions you have with your health care provider. Document Released: 07/22/2005 Document Revised: 12/29/2018 Document Reviewed: 09/28/2018 Elsevier Patient Education   2020 [employer]

## 2019-03-30 NOTE — Telephone Encounter (Addendum)
Attempted to contact the patient and left message for patient to call the office.   Patient states he has not increased Allopurinol. Patient does not currently need a refill and will contact the office when he needs a refill. Patient will get labs early next week .

## 2019-04-18 NOTE — Progress Notes (Deleted)
   Office Visit Note  Patient: Charles Little             Date of Birth: 1966-07-15           MRN: [id number]             PCP: Stephens Shire, MD Referring: Stephens Shire, MD Visit Date: 05/02/2019 Occupation: @GUAROCC @  Subjective:  No chief complaint on file.   History of Present Illness: Charles Little is a 53 y.o. male ***   Activities of Daily Living:  Patient reports morning stiffness for *** {minute/hour:19697}.   Patient {ACTIONS;DENIES/REPORTS:21021675::"Denies"} nocturnal pain.  Difficulty dressing/grooming: {ACTIONS;DENIES/REPORTS:21021675::"Denies"} Difficulty climbing stairs: {ACTIONS;DENIES/REPORTS:21021675::"Denies"} Difficulty getting out of chair: {ACTIONS;DENIES/REPORTS:21021675::"Denies"} Difficulty using hands for taps, buttons, cutlery, and/or writing: {ACTIONS;DENIES/REPORTS:21021675::"Denies"}  No Rheumatology ROS completed.   PMFS History:  There are no active problems to display for this patient.   Past Medical History:  Diagnosis Date  . High blood pressure   . Hypertension     Family History  Problem Relation Age of Onset  . Cancer Father    No past surgical history on file. Social History   Social History Narrative  . Not on file   Immunization History  Administered Date(s) Administered  . Tdap 02/03/2018     Objective: Vital Signs: There were no vitals taken for this visit.   Physical Exam   Musculoskeletal Exam: ***  CDAI Exam: CDAI Score: - Patient Global: -; Provider Global: - Swollen: -; Tender: - Joint Exam   No joint exam has been documented for this visit   There is currently no information documented on the homunculus. Go to the Rheumatology activity and complete the homunculus joint exam.  Investigation: No additional findings.  Imaging: No results found.  Recent Labs: Lab Results  Component Value Date   WBC 7.8 02/03/2018   HGB 17.0 02/03/2018   PLT 234 02/03/2018   NA 140 02/03/2018   K 3.8  02/03/2018   CL 110 02/03/2018   CO2 22 02/03/2018   GLUCOSE 103 (H) 02/03/2018   BUN 27 (H) 02/03/2018   CREATININE 0.91 02/03/2018   CALCIUM 8.3 (L) 02/03/2018   GFRAA >60 02/03/2018    Speciality Comments: No specialty comments available.  Procedures:  No procedures performed Allergies: Patient has no known allergies.   Assessment / Plan:     Visit Diagnoses: No diagnosis found.  Orders: No orders of the defined types were placed in this encounter.  No orders of the defined types were placed in this encounter.   Face-to-face time spent with patient was *** minutes. Greater than 50% of time was spent in counseling and coordination of care.  Follow-Up Instructions: No follow-ups on file.   Ofilia Neas, PA-C  Note - This record has been created using Dragon software.  Chart creation errors have been sought, but may not always  have been located. Such creation errors do not reflect on  the standard of medical care.

## 2019-05-02 ENCOUNTER — Ambulatory Visit: Payer: BC Managed Care – PPO | Admitting: Physician Assistant

## 2021-08-27 ENCOUNTER — Other Ambulatory Visit: Payer: Self-pay

## 2021-08-27 ENCOUNTER — Encounter: Payer: Self-pay | Admitting: Family Medicine

## 2021-08-27 ENCOUNTER — Ambulatory Visit (INDEPENDENT_AMBULATORY_CARE_PROVIDER_SITE_OTHER): Payer: 59 | Admitting: Family Medicine

## 2021-08-27 VITALS — BP 176/85 | HR 61 | Temp 98.3°F | Ht 70.0 in | Wt 254.0 lb

## 2021-08-27 DIAGNOSIS — Z6836 Body mass index (BMI) 36.0-36.9, adult: Secondary | ICD-10-CM

## 2021-08-27 DIAGNOSIS — I10 Essential (primary) hypertension: Secondary | ICD-10-CM | POA: Diagnosis not present

## 2021-08-27 DIAGNOSIS — Z7689 Persons encountering health services in other specified circumstances: Secondary | ICD-10-CM

## 2021-08-27 MED ORDER — AMLODIPINE BESYLATE 5 MG PO TABS: 5.0000 mg | ORAL_TABLET | Freq: Every day | ORAL | 2 refills | Status: DC

## 2021-08-27 NOTE — Patient Instructions (Signed)

## 2021-08-27 NOTE — Progress Notes (Signed)
New Patient Office Visit  Subjective:  Patient ID: Charles Little, male    DOB: [birth year]  Age: 55 y.o. MRN: [id number]  CC:  Chief Complaint  Patient presents with   New Patient (Initial Visit)    HPI Brittain Hosie presents to establish care. He has a history of HTN but has not been on medication for some times. He denies chest pain, shortness of breath, edema, dizziness, palpitations, visual disturbances, or fatigue. He is fasting this morning. He has not had labs done in years. He would like to lose weight. He reports that is diet is regular. He does not eat much but does not eat the most well balanced. He exercises 2x a week with an aerobics video for 15-20 minutes.   Past Medical History:  Diagnosis Date   High blood pressure    Hypertension     History reviewed. No pertinent surgical history.  Family History  Problem Relation Age of Onset   Cancer Father        Prostate   Hypertension Brother    Arthritis Maternal Grandmother     Social History   Socioeconomic History   Marital status: Single    Spouse name: Not on file   Number of children: 2   Years of education: 12   Highest education level: High school graduate  Occupational History   Not on file  Tobacco Use   Smoking status: Former    Packs/day: 0.50    Years: 5.00    Pack years: 2.50    Types: Cigarettes    Quit date: 1992    Years since quitting: 30.9   Smokeless tobacco: Never  Vaping Use   Vaping Use: Never used  Substance and Sexual Activity   Alcohol use: No   Drug use: No   Sexual activity: Yes  Other Topics Concern   Not on file  Social History Narrative   Not on file   Social Determinants of Health   Financial Resource Strain: Not on file  Food Insecurity: Not on file  Transportation Needs: Not on file  Physical Activity: Not on file  Stress: Not on file  Social Connections: Not on file  Intimate Partner Violence: Not on file    ROS Review of Systems As per HPI.    Objective:   Today's Vitals: BP (!) 176/85   Pulse 61   Temp 98.3 F (36.8 C) (Temporal)   Ht 5' 10"  (1.778 m)   Wt 254 lb (115.2 kg)   BMI 36.45 kg/m   Physical Exam Vitals and nursing note reviewed.  Constitutional:      General: He is not in acute distress.    Appearance: He is not ill-appearing, toxic-appearing or diaphoretic.  HENT:     Head: Normocephalic and atraumatic.  Neck:     Thyroid: No thyroid mass, thyromegaly or thyroid tenderness.  Cardiovascular:     Rate and Rhythm: Normal rate and regular rhythm.     Heart sounds: Normal heart sounds. No murmur heard. Pulmonary:     Effort: Pulmonary effort is normal. No respiratory distress.     Breath sounds: Normal breath sounds.  Abdominal:     General: Bowel sounds are normal. There is no distension.     Palpations: Abdomen is soft. There is no mass.     Tenderness: There is no abdominal tenderness. There is no guarding or rebound.  Musculoskeletal:     Cervical back: Neck supple.     Right lower leg:  No edema.     Left lower leg: No edema.  Skin:    General: Skin is warm and dry.  Neurological:     General: No focal deficit present.     Mental Status: He is alert and oriented to person, place, and time.  Psychiatric:        Mood and Affect: Mood normal.        Behavior: Behavior normal.    Assessment & Plan:   Asser was seen today for new patient (initial visit).  Diagnoses and all orders for this visit:  Primary hypertension Uncontrolled, asymptomatic. Labs pending. Start amlodipine as below. Discussed to check BP at home. Discussed goal of 130s/80s or less.  -     Thyroid Panel With TSH -     CBC with Differential/Platelet -     CMP14+EGFR -     Lipid panel -     amLODipine (NORVASC) 5 MG tablet; Take 1 tablet (5 mg total) by mouth daily.  Class 2 severe obesity due to excess calories with serious comorbidity and body mass index (BMI) of 36.0 to 36.9 in adult [facility]) With HTN. Discussed diet  and exercise. Labs pending.  -     Thyroid Panel With TSH -     CBC with Differential/Platelet -     CMP14+EGFR -     Lipid panel  Encounter to establish care Awaiting records.   Follow-up: Return in about 2 weeks (around 09/10/2021) for HTN.   The patient indicates understanding of these issues and agrees with the plan.  Gwenlyn Perking, FNP

## 2021-08-28 ENCOUNTER — Other Ambulatory Visit: Payer: Self-pay | Admitting: Family Medicine

## 2021-08-28 DIAGNOSIS — E785 Hyperlipidemia, unspecified: Secondary | ICD-10-CM

## 2021-08-28 LAB — CMP14+EGFR
ALT: 22 IU/L (ref 0–44)
AST: 22 IU/L (ref 0–40)
Albumin/Globulin Ratio: 1.8 (ref 1.2–2.2)
Albumin: 4.6 g/dL (ref 3.8–4.9)
Alkaline Phosphatase: 90 IU/L (ref 44–121)
BUN/Creatinine Ratio: 19 (ref 9–20)
BUN: 18 mg/dL (ref 6–24)
Bilirubin Total: 0.4 mg/dL (ref 0.0–1.2)
CO2: 25 mmol/L (ref 20–29)
Calcium: 9.4 mg/dL (ref 8.7–10.2)
Chloride: 100 mmol/L (ref 96–106)
Creatinine, Ser: 0.95 mg/dL (ref 0.76–1.27)
Globulin, Total: 2.5 g/dL (ref 1.5–4.5)
Glucose: 88 mg/dL (ref 70–99)
Potassium: 4.7 mmol/L (ref 3.5–5.2)
Sodium: 140 mmol/L (ref 134–144)
Total Protein: 7.1 g/dL (ref 6.0–8.5)
eGFR: 95 mL/min/{1.73_m2} (ref >59)

## 2021-08-28 LAB — CBC WITH DIFFERENTIAL/PLATELET
Basophils Absolute: 0 10*3/uL (ref 0.0–0.2)
Basos: 1 %
EOS (ABSOLUTE): 0.1 10*3/uL (ref 0.0–0.4)
Eos: 2 %
Hematocrit: 47.1 % (ref 37.5–51.0)
Hemoglobin: 16 g/dL (ref 13.0–17.7)
Immature Grans (Abs): 0 10*3/uL (ref 0.0–0.1)
Immature Granulocytes: 1 %
Lymphocytes Absolute: 2.4 10*3/uL (ref 0.7–3.1)
Lymphs: 36 %
MCH: 30.4 pg (ref 26.6–33.0)
MCHC: 34 g/dL (ref 31.5–35.7)
MCV: 90 fL (ref 79–97)
Monocytes Absolute: 0.6 10*3/uL (ref 0.1–0.9)
Monocytes: 10 %
Neutrophils Absolute: 3.5 10*3/uL (ref 1.4–7.0)
Neutrophils: 50 %
Platelets: 267 10*3/uL (ref 150–450)
RBC: 5.26 x10E6/uL (ref 4.14–5.80)
RDW: 12.4 % (ref 11.6–15.4)
WBC: 6.7 10*3/uL (ref 3.4–10.8)

## 2021-08-28 LAB — LIPID PANEL
Chol/HDL Ratio: 4.7 ratio (ref 0.0–5.0)
Cholesterol, Total: 183 mg/dL (ref 100–199)
HDL: 39 mg/dL — ABNORMAL LOW (ref >39)
LDL Chol Calc (NIH): 130 mg/dL — ABNORMAL HIGH (ref 0–99)
Triglycerides: 76 mg/dL (ref 0–149)
VLDL Cholesterol Cal: 14 mg/dL (ref 5–40)

## 2021-08-28 LAB — THYROID PANEL WITH TSH
Free Thyroxine Index: 2.4 (ref 1.2–4.9)
T3 Uptake Ratio: 27 % (ref 24–39)
T4, Total: 8.9 ug/dL (ref 4.5–12.0)
TSH: 0.961 u[IU]/mL (ref 0.450–4.500)

## 2021-08-28 MED ORDER — ATORVASTATIN CALCIUM 20 MG PO TABS: 20.0000 mg | ORAL_TABLET | Freq: Every day | ORAL | 3 refills | Status: DC

## 2021-09-10 ENCOUNTER — Encounter: Payer: Self-pay | Admitting: Family Medicine

## 2021-09-10 ENCOUNTER — Ambulatory Visit (INDEPENDENT_AMBULATORY_CARE_PROVIDER_SITE_OTHER): Payer: 59 | Admitting: Family Medicine

## 2021-09-10 VITALS — BP 138/72 | HR 65 | Temp 98.0°F | Ht 70.0 in | Wt 260.0 lb

## 2021-09-10 DIAGNOSIS — E785 Hyperlipidemia, unspecified: Secondary | ICD-10-CM

## 2021-09-10 DIAGNOSIS — I10 Essential (primary) hypertension: Secondary | ICD-10-CM

## 2021-09-10 NOTE — Patient Instructions (Signed)

## 2021-09-10 NOTE — Progress Notes (Signed)
Established Patient Office Visit  Subjective:  Patient ID: Charles Little, male    DOB: 1966/06/08  Age: 55 y.o. MRN: [id number]  CC:  Chief Complaint  Patient presents with   Hypertension    HPI Audon Heymann presents for HTN follow up.   HTN Complaint with meds - Yes Current Medications - amlodipine 5 mg Checking BP at home: no Exercising Regularly - No Pertinent ROS:  Headache - No Fatigue - No Visual Disturbances - No Chest pain - No Dyspnea - No Palpitations - No LE edema - No  He has also started on lipitor for his cholesterol. He has not made any diet or exercise changes yet.   They report good compliance with medications and can restate their regimen by memory. No medication side effects.  Family, social, and smoking history reviewed.   BP Readings from Last 3 Encounters:  09/10/21 138/72  08/27/21 (!) 176/85  03/30/19 (!) 168/103   CMP Latest Ref Rng & Units 08/27/2021 02/03/2018 03/07/2015  Glucose 70 - 99 mg/dL 88 103(H) 108(H)  BUN 6 - 24 mg/dL 18 27(H) 27(H)  Creatinine 0.76 - 1.27 mg/dL 0.95 0.91 1.01  Sodium 134 - 144 mmol/L 140 140 140  Potassium 3.5 - 5.2 mmol/L 4.7 3.8 3.7  Chloride 96 - 106 mmol/L 100 110 106  CO2 20 - 29 mmol/L _0 Calcium 8.7 - 10.2 mg/dL 9.4 8.3(L) 9.1  Total Protein 6.0 - 8.5 g/dL 7.1 - -  Total Bilirubin 0.0 - 1.2 mg/dL 0.4 - -  Alkaline Phos 44 - 121 IU/L 90 - -  AST 0 - 40 IU/L 22 - -  ALT 0 - 44 IU/L 22 - -         BP Readings from Last 3 Encounters:  09/10/21 138/72  08/27/21 (!) 176/85  03/30/19 (!) 168/103     Past Medical History:  Diagnosis Date   High blood pressure    Hypertension     No past surgical history on file.  Family History  Problem Relation Age of Onset   Cancer Father        Prostate   Hypertension Brother    Arthritis Maternal Grandmother     Social History   Socioeconomic History   Marital status: Single    Spouse name: Not on file   Number of children: 2    Years of education: 12   Highest education level: High school graduate  Occupational History   Not on file  Tobacco Use   Smoking status: Former    Packs/day: 0.50    Years: 5.00    Pack years: 2.50    Types: Cigarettes    Quit date: 1992    Years since quitting: 30.9   Smokeless tobacco: Never  Vaping Use   Vaping Use: Never used  Substance and Sexual Activity   Alcohol use: No   Drug use: No   Sexual activity: Yes  Other Topics Concern   Not on file  Social History Narrative   Not on file   Social Determinants of Health   Financial Resource Strain: Not on file  Food Insecurity: Not on file  Transportation Needs: Not on file  Physical Activity: Not on file  Stress: Not on file  Social Connections: Not on file  Intimate Partner Violence: Not on file    Outpatient Medications Prior to Visit  Medication Sig Dispense Refill   amLODipine (NORVASC) 5 MG tablet Take 1 tablet (5 mg  total) by mouth daily. 30 tablet 2   atorvastatin (LIPITOR) 20 MG tablet Take 1 tablet (20 mg total) by mouth daily. 90 tablet 3   No facility-administered medications prior to visit.    No Known Allergies  ROS Review of Systems As per HPI.   Objective:    Physical Exam Vitals and nursing note reviewed.  Constitutional:      General: He is not in acute distress.    Appearance: He is not ill-appearing, toxic-appearing or diaphoretic.  Neck:     Vascular: No carotid bruit or JVD.  Cardiovascular:     Rate and Rhythm: Normal rate and regular rhythm.     Heart sounds: Normal heart sounds. No murmur heard. Pulmonary:     Effort: Pulmonary effort is normal. No respiratory distress.     Breath sounds: Normal breath sounds. No wheezing, rhonchi or rales.  Chest:     Chest wall: No tenderness.  Musculoskeletal:     Right lower leg: No edema.     Left lower leg: No edema.  Skin:    General: Skin is warm and dry.  Neurological:     General: No focal deficit present.     Mental Status:  He is alert and oriented to person, place, and time.  Psychiatric:        Mood and Affect: Mood normal.        Behavior: Behavior normal.    BP 138/72    Pulse 65    Temp 98 F (36.7 C) (Temporal)    Ht 5' 10" (1.778 m)    Wt 260 lb (117.9 kg)    BMI 37.31 kg/m  Wt Readings from Last 3 Encounters:  09/10/21 260 lb (117.9 kg)  08/27/21 254 lb (115.2 kg)  03/30/19 254 lb (115.2 kg)     There are no preventive care reminders to display for this patient.  There are no preventive care reminders to display for this patient.  Lab Results  Component Value Date   TSH 0.961 08/27/2021   Lab Results  Component Value Date   WBC 6.7 08/27/2021   HGB 16.0 08/27/2021   HCT 47.1 08/27/2021   MCV 90 08/27/2021   PLT 267 08/27/2021   Lab Results  Component Value Date   NA 140 08/27/2021   K 4.7 08/27/2021   CO2 25 08/27/2021   GLUCOSE 88 08/27/2021   BUN 18 08/27/2021   CREATININE 0.95 08/27/2021   BILITOT 0.4 08/27/2021   ALKPHOS 90 08/27/2021   AST 22 08/27/2021   ALT 22 08/27/2021   PROT 7.1 08/27/2021   ALBUMIN 4.6 08/27/2021   CALCIUM 9.4 08/27/2021   ANIONGAP 8 02/03/2018   EGFR 95 08/27/2021   Lab Results  Component Value Date   CHOL 183 08/27/2021   Lab Results  Component Value Date   HDL 39 (L) 08/27/2021   Lab Results  Component Value Date   LDLCALC 130 (H) 08/27/2021   Lab Results  Component Value Date   TRIG 76 08/27/2021   Lab Results  Component Value Date   CHOLHDL 4.7 08/27/2021   No results found for: HGBA1C    Assessment & Plan:   Rydan was seen today for hypertension.  Diagnoses and all orders for this visit:  Primary hypertension Well controlled on current regimen. Continue amlodipine.   Hyperlipidemia, unspecified hyperlipidemia type Recently started on lipitor. Diet and exercise. Will recheck lipid panel at next visit.     Follow-up: Return in about 3  months (around 12/09/2021) for chronic follow up.  The patient indicates  understanding of these issues and agrees with the plan.     Gwenlyn Perking, FNP

## 2021-11-20 ENCOUNTER — Encounter: Payer: Self-pay | Admitting: Family Medicine

## 2021-11-20 ENCOUNTER — Ambulatory Visit (INDEPENDENT_AMBULATORY_CARE_PROVIDER_SITE_OTHER): Payer: 59 | Admitting: Family Medicine

## 2021-11-20 VITALS — BP 133/80 | HR 70 | Temp 97.8°F | Ht 70.0 in | Wt 255.6 lb

## 2021-11-20 DIAGNOSIS — B9689 Other specified bacterial agents as the cause of diseases classified elsewhere: Secondary | ICD-10-CM

## 2021-11-20 DIAGNOSIS — J988 Other specified respiratory disorders: Secondary | ICD-10-CM

## 2021-11-20 MED ORDER — METHYLPREDNISOLONE 4 MG PO TBPK: ORAL_TABLET | ORAL | 0 refills | Status: DC

## 2021-11-20 MED ORDER — AZITHROMYCIN 250 MG PO TABS: ORAL_TABLET | ORAL | 0 refills | Status: DC

## 2021-11-20 NOTE — Progress Notes (Signed)
? ?Assessment & Plan:  ?1. Bacterial respiratory infection ?Education provided on upper respiratory infections. Discussed symptom management.  ?- azithromycin (ZITHROMAX Z-PAK) 250 MG tablet; Take 2 tablets (500 mg) PO today, then 1 tablet (250 mg) PO daily x4 days.  Dispense: 6 tablet; Refill: 0 ?- methylPREDNISolone (MEDROL DOSEPAK) 4 MG TBPK tablet; Use as directed.  Dispense: 21 each; Refill: 0 ? ? ?Follow up plan: Return if symptoms worsen or fail to improve. ? ?[name], MSN, APRN, FNP-C ?[facility] ? ?Subjective:  ? ?Patient ID: Charles Little, male    DOB: 12/08/1965, 56 y.o.   MRN: [id number] ? ?HPI: ?Charles Little is a 56 y.o. male presenting on 11/20/2021 for Cough (Per pt been having cough, congestion for a week feels like its in the chest. ) ? ?Patient complains of cough, head/chest congestion, runny nose, and sore throat. Onset of symptoms was 1 week ago, unchanged since that time. He is drinking plenty of fluids. Evaluation to date: none. Treatment to date:  OTC cold medicines . He does not smoke.  ? ? ?ROS: Negative unless specifically indicated above in HPI.  ? ?Relevant past medical history reviewed and updated as indicated.  ? ?Allergies and medications reviewed and updated. ? ? ?Current Outpatient Medications:  ?  amLODipine (NORVASC) 5 MG tablet, Take 1 tablet (5 mg total) by mouth daily., Disp: 30 tablet, Rfl: 2 ?  atorvastatin (LIPITOR) 20 MG tablet, Take 1 tablet (20 mg total) by mouth daily., Disp: 90 tablet, Rfl: 3 ? ?No Known Allergies ? ?Objective:  ? ?BP 133/80   Pulse 70   Temp 97.8 ?F (36.6 ?C) (Temporal)   Ht 5\' 10"  (1.778 m)   Wt 255 lb 9.6 oz (115.9 kg)   SpO2 96%   BMI 36.67 kg/m?   ? ?Physical Exam ?Vitals reviewed.  ?Constitutional:   ?   General: He is not in acute distress. ?   Appearance: Normal appearance. He is not ill-appearing, toxic-appearing or diaphoretic.  ?HENT:  ?   Head: Normocephalic and atraumatic.  ?   Right Ear: Tympanic  membrane, ear canal and external ear normal. There is no impacted cerumen.  ?   Left Ear: Tympanic membrane, ear canal and external ear normal. There is no impacted cerumen.  ?   Nose: Congestion and rhinorrhea present. Rhinorrhea is clear.  ?   Right Sinus: No maxillary sinus tenderness or frontal sinus tenderness.  ?   Left Sinus: No maxillary sinus tenderness or frontal sinus tenderness.  ?   Mouth/Throat:  ?   Mouth: Mucous membranes are moist.  ?   Pharynx: Oropharynx is clear. No oropharyngeal exudate or posterior oropharyngeal erythema.  ?   Tonsils: No tonsillar exudate or tonsillar abscesses.  ?Eyes:  ?   General: No scleral icterus.    ?   Right eye: No discharge.     ?   Left eye: No discharge.  ?   Conjunctiva/sclera: Conjunctivae normal.  ?Cardiovascular:  ?   Rate and Rhythm: Normal rate.  ?Pulmonary:  ?   Effort: Pulmonary effort is normal. No respiratory distress.  ?Musculoskeletal:     ?   General: Normal range of motion.  ?   Cervical back: Normal range of motion.  ?Lymphadenopathy:  ?   Cervical: No cervical adenopathy.  ?Skin: ?   General: Skin is warm and dry.  ?Neurological:  ?   Mental Status: He is alert and oriented to person, place, and time. Mental status is  at baseline.  ?Psychiatric:     ?   Mood and Affect: Mood normal.     ?   Behavior: Behavior normal.     ?   Thought Content: Thought content normal.     ?   Judgment: Judgment normal.  ? ? ? ? ? ? ?

## 2021-11-24 ENCOUNTER — Other Ambulatory Visit: Payer: Self-pay | Admitting: Family Medicine

## 2021-11-24 DIAGNOSIS — I10 Essential (primary) hypertension: Secondary | ICD-10-CM

## 2022-05-01 ENCOUNTER — Other Ambulatory Visit: Payer: Self-pay | Admitting: Family Medicine

## 2022-05-01 DIAGNOSIS — I10 Essential (primary) hypertension: Secondary | ICD-10-CM

## 2023-05-26 DIAGNOSIS — Z1211 Encounter for screening for malignant neoplasm of colon: Secondary | ICD-10-CM | POA: Diagnosis not present

## 2023-08-05 DIAGNOSIS — D124 Benign neoplasm of descending colon: Secondary | ICD-10-CM | POA: Diagnosis not present

## 2023-08-05 DIAGNOSIS — Z1211 Encounter for screening for malignant neoplasm of colon: Secondary | ICD-10-CM | POA: Diagnosis not present

## 2023-08-05 DIAGNOSIS — K5730 Diverticulosis of large intestine without perforation or abscess without bleeding: Secondary | ICD-10-CM | POA: Diagnosis not present

## 2023-08-05 DIAGNOSIS — K635 Polyp of colon: Secondary | ICD-10-CM | POA: Diagnosis not present

## 2023-11-01 ENCOUNTER — Ambulatory Visit (INDEPENDENT_AMBULATORY_CARE_PROVIDER_SITE_OTHER): Payer: 59 | Admitting: Nurse Practitioner

## 2023-11-01 ENCOUNTER — Encounter: Payer: Self-pay | Admitting: Nurse Practitioner

## 2023-11-01 VITALS — BP 170/92 | HR 64 | Temp 97.7°F | Ht 70.0 in | Wt 248.0 lb

## 2023-11-01 DIAGNOSIS — I10 Essential (primary) hypertension: Secondary | ICD-10-CM

## 2023-11-01 DIAGNOSIS — R051 Acute cough: Secondary | ICD-10-CM | POA: Diagnosis not present

## 2023-11-01 DIAGNOSIS — H66002 Acute suppurative otitis media without spontaneous rupture of ear drum, left ear: Secondary | ICD-10-CM | POA: Diagnosis not present

## 2023-11-01 MED ORDER — AMLODIPINE BESYLATE 5 MG PO TABS: 5.0000 mg | ORAL_TABLET | Freq: Every day | ORAL | 0 refills | Status: DC

## 2023-11-01 MED ORDER — GUAIFENESIN 400 MG PO TABS: 400.0000 mg | ORAL_TABLET | Freq: Four times a day (QID) | ORAL | 0 refills | Status: DC | PRN

## 2023-11-01 MED ORDER — AMOXICILLIN-POT CLAVULANATE 875-125 MG PO TABS: 1.0000 | ORAL_TABLET | Freq: Two times a day (BID) | ORAL | 0 refills | Status: DC

## 2023-11-01 NOTE — Progress Notes (Addendum)
 Acute Office Visit  Subjective:     Patient ID: Charles Little, male    DOB: 08/13/1966, 58 y.o.   MRN: [id number]  Chief Complaint  Patient presents with   URI    Chest congestion, cough    HPI Charles Little is a 58 y.o. male 58 year old male present today November 01, 2023 for an acute visit complaining of cough chest congestion for 3 days.  He has a past medical history of hypertension, Hyperlipidemia has been without medication for over 2 years "cholestanol was seen at this practice was over 2 years ago when I went out of medication".    Cough who complains of non productive cough for 3 days. He denies a history of anorexia, chest pain, chills, fevers, nausea, vomiting, and wheezing and denies a history of asthma. He has not ry any OTC medicine  Otitis Media:  The infections typically affect the left ear and are typically manifested by congestion, cough.  Prior antibiotic therapy has included no recent courses. Middle ear fluid has been persistent for 1 week.   His nasal symptoms consist of cough.  A hearing problem is not suspected by history. A speech problem is not suspected by history.      HTN BP today 170/90 client has not been taking any medication for over 2 years now, denies headache, syncope, chest pain plan to restart amlodipine  5 mg daily and have him follow-up with his PCP in 3 weeks  Active Ambulatory Problems    Diagnosis Date Noted   No Active Ambulatory Problems   Resolved Ambulatory Problems    Diagnosis Date Noted   No Resolved Ambulatory Problems   Past Medical History:  Diagnosis Date   High blood pressure    Hypertension      Review of Systems  Constitutional:  Negative for chills and fever.  HENT:  Positive for ear discharge. Negative for congestion and sore throat.   Eyes:  Negative for pain.  Respiratory:  Positive for cough. Negative for sputum production, shortness of breath and wheezing.        Nonproductive  Cardiovascular:  Negative  for chest pain, palpitations and leg swelling.  Gastrointestinal:  Negative for blood in stool, constipation and nausea.  Skin:  Negative for itching and rash.  Neurological:  Negative for dizziness and headaches.   Negative unless indicated in HPI    Objective:    BP (!) 170/92   Pulse 64   Temp 97.7 F (36.5 C) (Oral)   Ht 5\' 10"  (1.778 m)   Wt 248 lb (112.5 kg)   SpO2 97%   BMI 35.58 kg/m  BP Readings from Last 3 Encounters:  11/01/23 (!) 170/92  11/20/21 133/80  09/10/21 138/72   Wt Readings from Last 3 Encounters:  11/01/23 248 lb (112.5 kg)  11/20/21 255 lb 9.6 oz (115.9 kg)  09/10/21 260 lb (117.9 kg)      Physical Exam Vitals and nursing note reviewed.  Constitutional:      General: He is not in acute distress.    Appearance: He is obese.  HENT:     Head: Normocephalic.     Right Ear: Tympanic membrane, ear canal and external ear normal. There is no impacted cerumen.     Left Ear: Swelling and tenderness present. Tympanic membrane is erythematous.     Nose: Nose normal. No congestion.     Mouth/Throat:     Mouth: Mucous membranes are moist.  Eyes:     General:  No scleral icterus.    Extraocular Movements: Extraocular movements intact.     Conjunctiva/sclera: Conjunctivae normal.     Pupils: Pupils are equal, round, and reactive to light.  Neck:     Vascular: No carotid bruit.  Cardiovascular:     Rate and Rhythm: Normal rate and regular rhythm.  Pulmonary:     Effort: Pulmonary effort is normal.     Breath sounds: Normal breath sounds.  Musculoskeletal:        General: Normal range of motion.     Cervical back: Normal range of motion and neck supple. No rigidity.     Right lower leg: No edema.     Left lower leg: No edema.  Lymphadenopathy:     Cervical: No cervical adenopathy.  Skin:    General: Skin is warm and dry.     Findings: No rash.  Neurological:     Mental Status: He is alert and oriented to person, place, and time. Mental status is  at baseline.  Psychiatric:        Mood and Affect: Mood normal.        Behavior: Behavior normal.        Thought Content: Thought content normal.        Judgment: Judgment normal.    No results found for any visits on 11/01/23.      Assessment & Plan:  Acute cough -     guaiFENesin ; Take 1 tablet (400 mg total) by mouth every 6 (six) hours as needed.  Dispense: 30 tablet; Refill: 0 -     Amoxicillin -Pot Clavulanate; Take 1 tablet by mouth 2 (two) times daily.  Dispense: 14 tablet; Refill: 0  Non-recurrent acute suppurative otitis media of left ear without spontaneous rupture of tympanic membrane -     Amoxicillin -Pot Clavulanate; Take 1 tablet by mouth 2 (two) times daily.  Dispense: 14 tablet; Refill: 0  Primary hypertension -     amLODIPine  Besylate; Take 1 tablet (5 mg total) by mouth daily.  Dispense: 30 tablet; Refill: 0   Charles Little is a 58 years old Caucasian male seen today for hypertension, cough, no acute distress  Hypertension: Will restart amlodipine  5 mg daily client to follow-up with PCP in 3 weeks  Otitis media: Will treat with amoxicillin  875 1 tab twice daily for 7 days  Cough: Guaifenesin  400 mg 3 times daily for cough, increase hydration  He will follow-up with PCP for his foot pain in 3 weeks  Encourage healthy lifestyle choices, including diet (rich in fruits, vegetables, and lean proteins, and low in salt and simple carbohydrates) and exercise (at least 30 minutes of moderate physical activity daily).     The above assessment and management plan was discussed with the patient. The patient verbalized understanding of and has agreed to the management plan. Patient is aware to call the clinic if they develop any new symptoms or if symptoms persist or worsen. Patient is aware when to return to the clinic for a follow-up visit. Patient educated on when it is appropriate to go to the emergency department.  Return in about 6 weeks (around 12/13/2023), or if symptoms  worsen or fail to improve.  Charles Tomko [place] Thompson, DNP [facility] [address] [place], [place] [address] [phone]  Note: This document was prepared by [employer] voice dictation technology and any errors that results from this process are unintentional.

## 2023-11-03 ENCOUNTER — Ambulatory Visit: Payer: 59 | Admitting: Family Medicine

## 2023-11-03 ENCOUNTER — Other Ambulatory Visit: Payer: Self-pay

## 2023-11-03 ENCOUNTER — Ambulatory Visit (INDEPENDENT_AMBULATORY_CARE_PROVIDER_SITE_OTHER): Payer: 59

## 2023-11-03 VITALS — BP 182/116 | HR 65 | Ht 70.0 in

## 2023-11-03 DIAGNOSIS — G8929 Other chronic pain: Secondary | ICD-10-CM | POA: Diagnosis not present

## 2023-11-03 DIAGNOSIS — M722 Plantar fascial fibromatosis: Secondary | ICD-10-CM | POA: Diagnosis not present

## 2023-11-03 DIAGNOSIS — M79671 Pain in right foot: Secondary | ICD-10-CM | POA: Diagnosis not present

## 2023-11-03 DIAGNOSIS — M7731 Calcaneal spur, right foot: Secondary | ICD-10-CM | POA: Diagnosis not present

## 2023-11-03 DIAGNOSIS — M7661 Achilles tendinitis, right leg: Secondary | ICD-10-CM | POA: Diagnosis not present

## 2023-11-03 DIAGNOSIS — M25561 Pain in right knee: Secondary | ICD-10-CM

## 2023-11-03 NOTE — Progress Notes (Signed)
Rubin Payor, PhD, LAT, ATC acting as a scribe for Clementeen Graham, MD.  Charles Little is a 58 y.o. male who presents to [facility] at [facility] today for R heel pain x couple months. Hx of ankle issues. Pain seems to be worse later in the day. Pain is disturbing his sleep. Pt locates pain to the medial aspect of his R calcaneous.   Pt also c/o R knee pain x 1 month. He thinks his heel pain has altered his gait, which is effecting his knee. Pt locates pain to the anterior aspect of his R knee and deep within the joint.   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  R Knee swelling: no Mechanical symptoms: no Aggravates: squatting Treatments tried: "pain pill"  Pertinent review of systems: No fevers or chills  Relevant historical information: Hypertension and insomnia.   Exam:  BP (!) 182/116   Pulse 65   Ht 5\' 10"  (1.778 m)   SpO2 96%   BMI 35.58 kg/m  General: Well Developed, well nourished, and in no acute distress.   MSK: Right knee normal-appearing no significant effusion. Normal motion. Mildly tender palpation medial joint line. Stable ligamentous exam. Negative McMurray's test. Intact strength.  Right foot and ankle.  Mild ankle pronation otherwise normal-appearing Tender palpation plantar medial calcaneus.  Normal foot and ankle motion. Stable ligamentous exam. Intact strength.    Lab and Radiology Results  Diagnostic Limited MSK Ultrasound of: Right foot and ankle medially Posterior tibialis tendon is intact with only minimal hypoechoic fluid around the tendon at the tarsal tunnel area.  No tear or severe tenosynovitis is visible. Plantar fascia visualized with no tear.  Plantar fascia measured just greater than 0.5 cm at insertion Impression: Plantar fasciitis  Procedure: Real-time Ultrasound Guided Injection of right knee joint superior lateral patella space Device: Philips Affiniti 50G/GE  Logiq Images permanently stored and available for review in PACS Verbal informed consent obtained.  Discussed risks and benefits of procedure. Warned about infection, bleeding, hyperglycemia damage to structures among others. Patient expresses understanding and agreement Time-out conducted.   Noted no overlying erythema, induration, or other signs of local infection.   Skin prepped in a sterile fashion.   Local anesthesia: Topical Ethyl chloride.   With sterile technique and under real time ultrasound guidance: 40 mg of Kenalog and 2 mL of Marcaine injected into knee joint. Fluid seen entering the joint capsule.   Completed without difficulty   Pain immediately resolved suggesting accurate placement of the medication.   Advised to call if fevers/chills, erythema, induration, drainage, or persistent bleeding.   Images permanently stored and available for review in the ultrasound unit.  Impression: Technically successful ultrasound guided injection.      X-ray images right knee and right calcaneus obtained today personally and independently interpreted.  Right knee: Minimal DJD.  No acute fractures are visible.  Right calcaneus: Tiny plantar calcaneal spur.  No acute fractures are visible.  Await formal radiology review    Assessment and Plan: 58 y.o. male with chronic right foot pain and chronic right knee pain.  Dominant issue today is plantar fasciitis.  Plan to treat with good heel cushioning eccentric exercises ice massage and night splint.  Reassess in 1 month.  He has had some medial ankle pain that is due to posterior tibialis tenosynovitis from limping.  This is a lesser issue today.  Additionally he is developed knee pain again  from limping.  Plan for intra-articular steroid injection and treatment of plantar fasciitis.  Recheck in 1 month.   PDMP not reviewed this encounter. Orders Placed This Encounter  Procedures   [place] LIMITED JOINT SPACE STRUCTURES LOW RIGHT(NO  LINKED CHARGES)    Reason for Exam (SYMPTOM  OR DIAGNOSIS REQUIRED):   right knee pain    Preferred imaging location?:   [facility] Knee AP/LAT W/[place] Right    Standing Status:   Future    Expiration Date:   12/01/2023    Reason for Exam (SYMPTOM  OR DIAGNOSIS REQUIRED):   right knee pain    Preferred imaging location?:   [place] [facility]   DG Os [place] Right    Standing Status:   Future    Expiration Date:   11/02/2024    Reason for Exam (SYMPTOM  OR DIAGNOSIS REQUIRED):   right heel pain    Preferred imaging location?:   [place]   No orders of the defined types were placed in this encounter.    Discussed warning signs or symptoms. Please see discharge instructions. Patient expresses understanding.   The above documentation has been reviewed and is accurate and complete Clementeen Graham, M.D.

## 2023-11-03 NOTE — Patient Instructions (Addendum)
Thank you for coming in today.   Please use Voltaren gel (Generic Diclofenac Gel) up to 4x daily for pain as needed.  This is available over-the-counter as both the name brand Voltaren gel and the generic diclofenac gel.   Please get an Xray today before you leave   Good arch support  Gel heel cup  Ice massage  Plantar fascia night splint  Please work on the home exercises the athletic trainer went over with you:

## 2023-11-18 NOTE — Progress Notes (Signed)
Right knee x-ray looks normal to radiology

## 2023-11-18 NOTE — Progress Notes (Signed)
 Right heel x-ray shows a tiny heel spur.  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

## 2023-11-22 ENCOUNTER — Ambulatory Visit: Payer: 59 | Admitting: Family Medicine

## 2023-11-22 ENCOUNTER — Encounter: Payer: Self-pay | Admitting: Family Medicine

## 2023-11-22 VITALS — BP 180/93 | HR 57 | Temp 98.2°F | Ht 70.0 in | Wt 244.4 lb

## 2023-11-22 DIAGNOSIS — E782 Mixed hyperlipidemia: Secondary | ICD-10-CM | POA: Diagnosis not present

## 2023-11-22 DIAGNOSIS — Z6835 Body mass index (BMI) 35.0-35.9, adult: Secondary | ICD-10-CM

## 2023-11-22 DIAGNOSIS — I10 Essential (primary) hypertension: Secondary | ICD-10-CM

## 2023-11-22 DIAGNOSIS — Z0000 Encounter for general adult medical examination without abnormal findings: Secondary | ICD-10-CM

## 2023-11-22 DIAGNOSIS — Z0001 Encounter for general adult medical examination with abnormal findings: Secondary | ICD-10-CM

## 2023-11-22 MED ORDER — AMLODIPINE BESYLATE 10 MG PO TABS: 10.0000 mg | ORAL_TABLET | Freq: Every day | ORAL | 3 refills | Status: AC

## 2023-11-22 NOTE — Progress Notes (Signed)
 Complete physical exam  Patient: Charles Little   DOB: Jul 11, 1966   58 y.o. Male  MRN: [id number]  Subjective:    Chief Complaint  Patient presents with   Medical Management of Chronic Issues   Annual Exam    Charles Little is a 58 y.o. male who presents today for a complete physical exam. He reports consuming a  general  diet. Gym/ health club routine includes mod to heavy weightlifting and treadmill. He generally feels well. He reports sleeping well. He does not have additional problems to discuss today.   Restarted on amlodipine 3 weeks ago for elevated BP. He has been taking this daily, though he has not yet had it today. Denies visual disturbances, chest pain, shortness of breath, edema, dizziness palpitation. Has not been checking BP at home.   Has been improving diet and has lost some weight.    Most recent fall risk assessment:    11/01/2023    1:56 PM  Fall Risk   Falls in the past year? 0  Number falls in past yr: 0  Injury with Fall? 0  Risk for fall due to : No Fall Risks  Follow up Falls evaluation completed     Most recent depression screenings:    11/22/2023    8:49 AM 11/01/2023    1:56 PM  PHQ 2/9 Scores  PHQ - 2 Score 0 0  PHQ- 9 Score 0     Vision:Not within last year  and Dental: No current dental problems and No regular dental care   Past Medical History:  Diagnosis Date   High blood pressure    Hypertension       Patient Care Team: Gabriel Earing, FNP as PCP - General (Family Medicine)   Outpatient Medications Prior to Visit  Medication Sig   amLODipine (NORVASC) 5 MG tablet Take 1 tablet (5 mg total) by mouth daily.   atorvastatin (LIPITOR) 20 MG tablet Take 1 tablet (20 mg total) by mouth daily. (Patient not taking: Reported on 11/01/2023)   [DISCONTINUED] amoxicillin-clavulanate (AUGMENTIN) 875-125 MG tablet Take 1 tablet by mouth 2 (two) times daily.   [DISCONTINUED] guaifenesin (HUMIBID E) 400 MG TABS tablet Take 1 tablet (400 mg  total) by mouth every 6 (six) hours as needed.   No facility-administered medications prior to visit.    ROS Negative unless specially indicated above in HPI.     Objective:     BP (!) 180/93   Pulse (!) 57   Temp 98.2 F (36.8 C) (Temporal)   Ht 5\' 10"  (1.778 m)   Wt 244 lb 6.4 oz (110.9 kg)   SpO2 97%   BMI 35.07 kg/m  BP Readings from Last 3 Encounters:  11/22/23 (!) 180/93  11/03/23 (!) 182/116  11/01/23 (!) 170/92   Wt Readings from Last 3 Encounters:  11/22/23 244 lb 6.4 oz (110.9 kg)  11/01/23 248 lb (112.5 kg)  11/20/21 255 lb 9.6 oz (115.9 kg)     Physical Exam Vitals and nursing note reviewed.  Constitutional:      General: He is not in acute distress.    Appearance: He is obese. He is not ill-appearing, toxic-appearing or diaphoretic.  HENT:     Head: Normocephalic.     Right Ear: Tympanic membrane, ear canal and external ear normal.     Left Ear: Tympanic membrane, ear canal and external ear normal.     Nose: Nose normal.     Mouth/Throat:  Mouth: Mucous membranes are moist.     Pharynx: Oropharynx is clear.  Eyes:     Extraocular Movements: Extraocular movements intact.     Conjunctiva/sclera: Conjunctivae normal.     Pupils: Pupils are equal, round, and reactive to light.  Neck:     Thyroid: No thyroid mass, thyromegaly or thyroid tenderness.  Cardiovascular:     Rate and Rhythm: Normal rate and regular rhythm.     Pulses: Normal pulses.     Heart sounds: Normal heart sounds. No murmur heard.    No friction rub. No gallop.  Pulmonary:     Effort: Pulmonary effort is normal.     Breath sounds: Normal breath sounds.  Abdominal:     General: Bowel sounds are normal. There is no distension.     Palpations: Abdomen is soft. There is no mass.     Tenderness: There is no abdominal tenderness. There is no guarding.  Musculoskeletal:     Cervical back: Normal range of motion and neck supple. No tenderness.     Right lower leg: No edema.      Left lower leg: No edema.  Skin:    General: Skin is warm and dry.     Capillary Refill: Capillary refill takes less than 2 seconds.     Findings: No lesion or rash.  Neurological:     General: No focal deficit present.     Mental Status: He is alert and oriented to person, place, and time.     Cranial Nerves: No cranial nerve deficit.     Motor: No weakness.     Coordination: Coordination normal.     Gait: Gait normal.  Psychiatric:        Mood and Affect: Mood normal.        Behavior: Behavior normal.        Thought Content: Thought content normal.        Judgment: Judgment normal.      No results found for any visits on 11/22/23.     Assessment & Plan:    Routine Health Maintenance and Physical Exam  Charles "Trey Paula" was seen today for medical management of chronic issues and annual exam.  Diagnoses and all orders for this visit:  Routine general medical examination at a health care facility  Primary hypertension Uncontrolled. Asymptomatic. Increase amlodipine to 10 mg daily. Monitor BP at home. Will recheck in 2 weeks. Labs pending.  -     amLODipine (NORVASC) 10 MG tablet; Take 1 tablet (10 mg total) by mouth daily. -     CBC with Differential/Platelet -     CMP14+EGFR -     TSH  Mixed hyperlipidemia Not on statin. Fasting panel pending.  -     Lipid panel  Obesity, morbid (HCC) Diet, exercise, weight loss.  -     amLODipine (NORVASC) 10 MG tablet; Take 1 tablet (10 mg total) by mouth daily. -     CBC with Differential/Platelet -     CMP14+EGFR -     Lipid panel -     TSH    Immunization History  Administered Date(s) Administered   Tdap 02/03/2018    Health Maintenance  Topic Date Due   INFLUENZA VACCINE  12/20/2023 (Originally 04/22/2023)   Hepatitis C Screening  11/21/2024 (Originally 06/04/1985)   HIV Screening  11/21/2024 (Originally 06/04/1982)   COVID-19 Vaccine (1 - 2024-25 season) 12/07/2024 (Originally 05/23/2023)   Zoster Vaccines- Shingrix (1  of 2) 02/21/2025 (Originally 06/04/2017)  DTaP/Tdap/Td (2 - Td or Tdap) 02/04/2028   Colonoscopy  08/04/2033   HPV VACCINES  Aged Out    Discussed health benefits of physical activity, and encouraged him to engage in regular exercise appropriate for his age and condition.  Problem List Items Addressed This Visit       Cardiovascular and Mediastinum   Primary hypertension   Relevant Medications   amLODipine (NORVASC) 10 MG tablet   Other Relevant Orders   CBC with Differential/Platelet   CMP14+EGFR   TSH     Other   Mixed hyperlipidemia   Relevant Medications   amLODipine (NORVASC) 10 MG tablet   Other Relevant Orders   Lipid panel   Obesity, morbid (HCC)   Other Visit Diagnoses       Routine general medical examination at a health care facility    -  Primary      Return in about 2 years (around 11/21/2025) for nurse visit for BP check, 3 months chronic follow up .   The patient indicates understanding of these issues and agrees with the plan.  Gabriel Earing, FNP

## 2023-11-22 NOTE — Patient Instructions (Signed)
 Health Maintenance, Male  Adopting a healthy lifestyle and getting preventive care are important in promoting health and wellness. Ask your health care provider about:  The right schedule for you to have regular tests and exams.  Things you can do on your own to prevent diseases and keep yourself healthy.  What should I know about diet, weight, and exercise?  Eat a healthy diet    Eat a diet that includes plenty of vegetables, fruits, low-fat dairy products, and lean protein.  Do not eat a lot of foods that are high in solid fats, added sugars, or sodium.  Maintain a healthy weight  Body mass index (BMI) is a measurement that can be used to identify possible weight problems. It estimates body fat based on height and weight. Your health care provider can help determine your BMI and help you achieve or maintain a healthy weight.  Get regular exercise  Get regular exercise. This is one of the most important things you can do for your health. Most adults should:  Exercise for at least 150 minutes each week. The exercise should increase your heart rate and make you sweat (moderate-intensity exercise).  Do strengthening exercises at least twice a week. This is in addition to the moderate-intensity exercise.  Spend less time sitting. Even light physical activity can be beneficial.  Watch cholesterol and blood lipids  Have your blood tested for lipids and cholesterol at 57 years of age, then have this test every 5 years.  You may need to have your cholesterol levels checked more often if:  Your lipid or cholesterol levels are high.  You are older than 57 years of age.  You are at high risk for heart disease.  What should I know about cancer screening?  Many types of cancers can be detected early and may often be prevented. Depending on your health history and family history, you may need to have cancer screening at various ages. This may include screening for:  Colorectal cancer.  Prostate cancer.  Skin cancer.  Lung  cancer.  What should I know about heart disease, diabetes, and high blood pressure?  Blood pressure and heart disease  High blood pressure causes heart disease and increases the risk of stroke. This is more likely to develop in people who have high blood pressure readings or are overweight.  Talk with your health care provider about your target blood pressure readings.  Have your blood pressure checked:  Every 3-5 years if you are 9-95 years of age.  Every year if you are 85 years old or older.  If you are between the ages of 29 and 29 and are a current or former smoker, ask your health care provider if you should have a one-time screening for abdominal aortic aneurysm (AAA).  Diabetes  Have regular diabetes screenings. This checks your fasting blood sugar level. Have the screening done:  Once every three years after age 23 if you are at a normal weight and have a low risk for diabetes.  More often and at a younger age if you are overweight or have a high risk for diabetes.  What should I know about preventing infection?  Hepatitis B  If you have a higher risk for hepatitis B, you should be screened for this virus. Talk with your health care provider to find out if you are at risk for hepatitis B infection.  Hepatitis C  Blood testing is recommended for:  Everyone born from [birth year] through 1964.  Anyone  with known risk factors for hepatitis C.  Sexually transmitted infections (STIs)  You should be screened each year for STIs, including gonorrhea and chlamydia, if:  You are sexually active and are younger than 57 years of age.  You are older than 57 years of age and your health care provider tells you that you are at risk for this type of infection.  Your sexual activity has changed since you were last screened, and you are at increased risk for chlamydia or gonorrhea. Ask your health care provider if you are at risk.  Ask your health care provider about whether you are at high risk for HIV. Your health care provider  may recommend a prescription medicine to help prevent HIV infection. If you choose to take medicine to prevent HIV, you should first get tested for HIV. You should then be tested every 3 months for as long as you are taking the medicine.  Follow these instructions at home:  Alcohol use  Do not drink alcohol if your health care provider tells you not to drink.  If you drink alcohol:  Limit how much you have to 0-2 drinks a day.  Know how much alcohol is in your drink. In the [place] one drink equals one 12 oz bottle of beer (355 mL), one 5 oz glass of wine (148 mL), or one 1 oz glass of hard liquor (44 mL).  Lifestyle  Do not use any products that contain nicotine or tobacco. These products include cigarettes, chewing tobacco, and vaping devices, such as e-cigarettes. If you need help quitting, ask your health care provider.  Do not use street drugs.  Do not share needles.  Ask your health care provider for help if you need support or information about quitting drugs.  General instructions  Schedule regular health, dental, and eye exams.  Stay current with your vaccines.  Tell your health care provider if:  You often feel depressed.  You have ever been abused or do not feel safe at home.  Summary  Adopting a healthy lifestyle and getting preventive care are important in promoting health and wellness.  Follow your health care provider's instructions about healthy diet, exercising, and getting tested or screened for diseases.  Follow your health care provider's instructions on monitoring your cholesterol and blood pressure.  This information is not intended to replace advice given to you by your health care provider. Make sure you discuss any questions you have with your health care provider.  Document Revised: 01/27/2021 Document Reviewed: 01/27/2021  Elsevier Patient Education  2024 [employer].

## 2023-11-23 LAB — CBC WITH DIFFERENTIAL/PLATELET
Basophils Absolute: 0 10*3/uL (ref 0.0–0.2)
Basos: 1 %
EOS (ABSOLUTE): 0.1 10*3/uL (ref 0.0–0.4)
Eos: 2 %
Hematocrit: 46.2 % (ref 37.5–51.0)
Hemoglobin: 15.7 g/dL (ref 13.0–17.7)
Immature Grans (Abs): 0.1 10*3/uL (ref 0.0–0.1)
Immature Granulocytes: 1 %
Lymphocytes Absolute: 2 10*3/uL (ref 0.7–3.1)
Lymphs: 31 %
MCH: 30.8 pg (ref 26.6–33.0)
MCHC: 34 g/dL (ref 31.5–35.7)
MCV: 91 fL (ref 79–97)
Monocytes Absolute: 0.6 10*3/uL (ref 0.1–0.9)
Monocytes: 9 %
Neutrophils Absolute: 3.8 10*3/uL (ref 1.4–7.0)
Neutrophils: 56 %
Platelets: 254 10*3/uL (ref 150–450)
RBC: 5.1 x10E6/uL (ref 4.14–5.80)
RDW: 12.4 % (ref 11.6–15.4)
WBC: 6.6 10*3/uL (ref 3.4–10.8)

## 2023-11-23 LAB — CMP14+EGFR
ALT: 21 IU/L (ref 0–44)
AST: 18 IU/L (ref 0–40)
Albumin: 4.2 g/dL (ref 3.8–4.9)
Alkaline Phosphatase: 103 IU/L (ref 44–121)
BUN/Creatinine Ratio: 19 (ref 9–20)
BUN: 19 mg/dL (ref 6–24)
Bilirubin Total: 0.3 mg/dL (ref 0.0–1.2)
CO2: 24 mmol/L (ref 20–29)
Calcium: 9.6 mg/dL (ref 8.7–10.2)
Chloride: 103 mmol/L (ref 96–106)
Creatinine, Ser: 1.02 mg/dL (ref 0.76–1.27)
Globulin, Total: 2.8 g/dL (ref 1.5–4.5)
Glucose: 95 mg/dL (ref 70–99)
Potassium: 4.5 mmol/L (ref 3.5–5.2)
Sodium: 141 mmol/L (ref 134–144)
Total Protein: 7 g/dL (ref 6.0–8.5)
eGFR: 86 mL/min/{1.73_m2} (ref >59)

## 2023-11-23 LAB — LIPID PANEL
Chol/HDL Ratio: 4.1 ratio (ref 0.0–5.0)
Cholesterol, Total: 178 mg/dL (ref 100–199)
HDL: 43 mg/dL (ref >39)
LDL Chol Calc (NIH): 121 mg/dL — ABNORMAL HIGH (ref 0–99)
Triglycerides: 72 mg/dL (ref 0–149)
VLDL Cholesterol Cal: 14 mg/dL (ref 5–40)

## 2023-11-23 LAB — TSH: TSH: 0.828 u[IU]/mL (ref 0.450–4.500)

## 2023-11-24 ENCOUNTER — Other Ambulatory Visit: Payer: Self-pay | Admitting: Family Medicine

## 2023-11-24 ENCOUNTER — Other Ambulatory Visit: Payer: Self-pay | Admitting: Nurse Practitioner

## 2023-11-24 DIAGNOSIS — I10 Essential (primary) hypertension: Secondary | ICD-10-CM

## 2023-11-24 DIAGNOSIS — E782 Mixed hyperlipidemia: Secondary | ICD-10-CM

## 2023-11-24 MED ORDER — ROSUVASTATIN CALCIUM 10 MG PO TABS: 10.0000 mg | ORAL_TABLET | Freq: Every day | ORAL | 3 refills | Status: AC

## 2023-12-01 ENCOUNTER — Ambulatory Visit: Payer: 59 | Admitting: Family Medicine

## 2023-12-01 ENCOUNTER — Encounter: Payer: Self-pay | Admitting: Family Medicine

## 2023-12-01 ENCOUNTER — Other Ambulatory Visit: Payer: Self-pay

## 2023-12-01 VITALS — BP 144/86 | HR 90 | Ht 70.0 in

## 2023-12-01 DIAGNOSIS — M25561 Pain in right knee: Secondary | ICD-10-CM | POA: Diagnosis not present

## 2023-12-01 DIAGNOSIS — M79671 Pain in right foot: Secondary | ICD-10-CM | POA: Diagnosis not present

## 2023-12-01 DIAGNOSIS — G8929 Other chronic pain: Secondary | ICD-10-CM

## 2023-12-01 DIAGNOSIS — M722 Plantar fascial fibromatosis: Secondary | ICD-10-CM

## 2023-12-01 NOTE — Progress Notes (Signed)
   I, Stevenson Clinch, CMA acting as a scribe for Charles Graham, MD.  Charles Little is a 58 y.o. male who presents to [facility] at [facility] today for f/u R knee and R heel pain. Pt was last seen by Dr. [name] on 11/03/23 and was given a R knee steroid injection and was taught HEP, advised to use heel cushioning, ice massage, and night splint.  Today, pt reports improvement of knee sx after injections. Continues to have some pain in the heel towards end of day. Denies visible swelling. Has not gotten heel cushion or night splint. Has been compliant with HEP. Got new boots that are more supportive to ankles and more cushioned sole, this has been helpful for sx.   Dx imaging: 11/03/23 R os calcis & R knee XR  Pertinent review of systems: No fevers or chills  Relevant historical information: Hypertension   Exam:  BP (!) 144/86   Pulse 90   Ht 5\' 10"  (1.778 m)   SpO2 97%   BMI 35.07 kg/m  General: Well Developed, well nourished, and in no acute distress.   MSK: Right heel normal motion. Right knee normal motion normal gait.     Assessment and Plan: 58 y.o. male with right plantar fasciitis improving with conservative management.  Continue home exercise program and recheck as needed.  Knee pain improved with injection.  Check back as needed.   PDMP not reviewed this encounter. No orders of the defined types were placed in this encounter.  No orders of the defined types were placed in this encounter.    Discussed warning signs or symptoms. Please see discharge instructions. Patient expresses understanding.   The above documentation has been reviewed and is accurate and complete Charles Little, M.D.

## 2023-12-01 NOTE — Patient Instructions (Signed)
 Thank you for coming in today.   Keep working on home exercises  Check back as needed

## 2023-12-09 ENCOUNTER — Ambulatory Visit: Payer: Self-pay

## 2024-02-23 ENCOUNTER — Ambulatory Visit: Admitting: Family Medicine

## 2024-02-23 ENCOUNTER — Encounter: Payer: Self-pay | Admitting: Family Medicine

## 2024-03-14 ENCOUNTER — Ambulatory Visit: Payer: Self-pay

## 2024-03-14 ENCOUNTER — Ambulatory Visit
Admission: RE | Admit: 2024-03-14 | Discharge: 2024-03-14 | Disposition: A | Discharge status: Home / Self Care | Source: Ambulatory Visit | Attending: Nurse Practitioner | Admitting: Nurse Practitioner

## 2024-03-14 VITALS — BP 159/107 | HR 59 | Temp 98.1°F | Resp 18

## 2024-03-14 DIAGNOSIS — S20462A Insect bite (nonvenomous) of left back wall of thorax, initial encounter: Secondary | ICD-10-CM

## 2024-03-14 DIAGNOSIS — R21 Rash and other nonspecific skin eruption: Secondary | ICD-10-CM | POA: Diagnosis not present

## 2024-03-14 DIAGNOSIS — W57XXXA Bitten or stung by nonvenomous insect and other nonvenomous arthropods, initial encounter: Secondary | ICD-10-CM | POA: Diagnosis not present

## 2024-03-14 MED ORDER — DOXYCYCLINE HYCLATE 100 MG PO CAPS: 200.0000 mg | ORAL_CAPSULE | Freq: Once | ORAL | 0 refills | Status: AC

## 2024-03-14 NOTE — Telephone Encounter (Signed)
 Pt has appt with urgent care today.

## 2024-03-14 NOTE — ED Triage Notes (Signed)
 Pt reports tick bite on the left flank under the arm, pt states he was bitten either Saturday or Sunday found it on Monday knot is present.

## 2024-03-14 NOTE — Discharge Instructions (Signed)
 We removed a small piece of what appeared to be tick remains from the spot on your side today.  Please take the doxycycline 200 mg once as we discussed to prophylactic treat for tick borne illness.  If you develop headache, fever, new body aches, joint pain, or muscle pain, or feelings of the flu, please seek care for re-evaluation of tick borne illness.

## 2024-03-14 NOTE — ED Provider Notes (Signed)
 [facility]    CSN: [id number] Arrival date & time: 03/14/24  1032      History   Chief Complaint Chief Complaint  Patient presents with   Tick Removal    Redness, warmth - Entered by patient    HPI Charles Little is a 58 y.o. male.   Patient presents today with tick bite to left side.  Reports yesterday, he felt something on the side, slapped at it, and then tilted to pull off his skin.  Now, he has a red, itchy rash around the area and is concerned the tick had may be embedded in his skin.  He denies fever, body aches or chills, nausea or vomiting, shortness of breath or chest pain, headache, new muscle pain or joint aches.  He works as an Orthoptist.  He does not know how long the tick was attached to his skin but reports the tick was not engorged.    Past Medical History:  Diagnosis Date   High blood pressure    Hypertension     Patient Active Problem List   Diagnosis Date Noted   Mixed hyperlipidemia 11/22/2023   Obesity, morbid (HCC) 11/22/2023   Plantar fasciitis, right 11/03/2023   Primary hypertension 11/09/2016   Esophageal reflux 11/09/2016   Insomnia 11/09/2016    History reviewed. No pertinent surgical history.     Home Medications    Prior to Admission medications   Medication Sig Start Date End Date Taking? Authorizing Provider  doxycycline (VIBRAMYCIN) 100 MG capsule Take 2 capsules (200 mg total) by mouth once for 1 dose. 03/14/24 03/14/24 Yes Chandra Harlene LABOR, NP  amLODipine  (NORVASC ) 10 MG tablet Take 1 tablet (10 mg total) by mouth daily. 11/22/23   Joesph Annabella HERO, FNP  rosuvastatin  (CRESTOR ) 10 MG tablet Take 1 tablet (10 mg total) by mouth daily. 11/24/23   Joesph Annabella HERO, FNP    Family History Family History  Problem Relation Age of Onset   Cancer Father        Prostate   Hypertension Brother    Arthritis Maternal Grandmother     Social History Social History   Tobacco Use   Smoking status: Former    Current  packs/day: 0.00    Average packs/day: 0.5 packs/day for 5.0 years (2.5 ttl pk-yrs)    Types: Cigarettes    Start date: 68    Quit date: 1992    Years since quitting: 33.5   Smokeless tobacco: Never  Vaping Use   Vaping status: Never Used  Substance Use Topics   Alcohol use: No   Drug use: No     Allergies   Patient has no known allergies.   Review of Systems Review of Systems Per HPI  Physical Exam Triage Vital Signs ED Triage Vitals  Encounter Vitals Group     BP 03/14/24 1051 (!) 159/107     Girls Systolic BP Percentile --      Girls Diastolic BP Percentile --      Boys Systolic BP Percentile --      Boys Diastolic BP Percentile --      Pulse Rate 03/14/24 1051 (!) 59     Resp 03/14/24 1051 18     Temp 03/14/24 1051 98.1 F (36.7 C)     Temp Source 03/14/24 1051 Oral     SpO2 03/14/24 1051 96 %     Weight --      Height --      Head Circumference --  Peak Flow --      Pain Score 03/14/24 1054 0     Pain Loc --      Pain Education --      Exclude from Growth Chart --    No data found.  Updated Vital Signs BP (!) 159/107 (BP Location: Right Arm)   Pulse (!) 59   Temp 98.1 F (36.7 C) (Oral)   Resp 18   SpO2 96%   Visual Acuity Right Eye Distance:   Left Eye Distance:   Bilateral Distance:    Right Eye Near:   Left Eye Near:    Bilateral Near:     Physical Exam Vitals and nursing note reviewed.  Constitutional:      General: He is not in acute distress.    Appearance: Normal appearance. He is not toxic-appearing.  HENT:     Head: Normocephalic and atraumatic.     Mouth/Throat:     Mouth: Mucous membranes are moist.  Pulmonary:     Effort: Pulmonary effort is normal. No respiratory distress.   Skin:    General: Skin is warm and dry.     Capillary Refill: Capillary refill takes less than 2 seconds.     Coloration: Skin is not jaundiced or pale.     Findings: Erythema and rash present.     Comments: Erythematous, circular rash noted  to left trunk.  See photograph below.  Area is approximately 11 x 6 cm.  No erythema migrans appreciated.   Neurological:     Mental Status: He is alert and oriented to person, place, and time.   Psychiatric:        Behavior: Behavior is cooperative.       UC Treatments / Results  Labs (all labs ordered are listed, but only abnormal results are displayed) Labs Reviewed - No data to display  EKG   Radiology No results found.  Procedures Procedures (including critical care time)  Medications Ordered in UC Medications - No data to display  Initial Impression / Assessment and Plan / UC Course  I have reviewed the triage vital signs and the nursing notes.  Pertinent labs & imaging results that were available during my care of the patient were reviewed by me and considered in my medical decision making (see chart for details).   Patient is mildly hypertensive in triage today, otherwise vital signs are stable.  1. Tick bite of left back wall of thorax, initial encounter 2. Rash and nonspecific skin eruption Vitals and exam are reassuring today No erythema migrans appreciated There did appear to be a foreign body in the center of the rash, I cleaned the skin with alcohol and using forceps, removed the foreign body successfully Patient tolerated well Area was cleaned with alcohol after and I recommended wound care to the area Will treat patient with doxycycline 200 mg once for possible exposure to Lyme disease and signs and symptoms discussed and when to return or seek emergent care  The patient was given the opportunity to ask questions.  All questions answered to their satisfaction.  The patient is in agreement to this plan.   Final Clinical Impressions(s) / UC Diagnoses   Final diagnoses:  Tick bite of left back wall of thorax, initial encounter  Rash and nonspecific skin eruption     Discharge Instructions      We removed a small piece of what appeared to be tick  remains from the spot on your side today.  Please take  the doxycycline 200 mg once as we discussed to prophylactic treat for tick borne illness.  If you develop headache, fever, new body aches, joint pain, or muscle pain, or feelings of the flu, please seek care for re-evaluation of tick borne illness.   ED Prescriptions     Medication Sig Dispense Auth. Provider   doxycycline (VIBRAMYCIN) 100 MG capsule Take 2 capsules (200 mg total) by mouth once for 1 dose. 2 capsule Chandra Harlene LABOR, NP      PDMP not reviewed this encounter.   Chandra Harlene LABOR, NP 03/14/24 [phone]

## 2024-03-14 NOTE — Telephone Encounter (Signed)
 FYI Only or Action Required?: FYI only for provider.  Patient was last seen in primary care on 11/22/2023 by Joesph Annabella HERO, FNP. Called Nurse Triage reporting Tick Bite Symptoms began yesterday. Interventions attempted: Nothing. Symptoms are: gradually worsening.  Triage Disposition: See HCP Within 4 Hours (Or PCP Triage)  Patient/caregiver understands and will follow disposition?: Yes       Copied from CRM [id number]. Topic: Clinical - Red Word Triage >> Mar 14, 2024  8:03 AM Donna BRAVO wrote: Red Word that prompted transfer to Nurse Triage: patient was bit by tick bite on left thigh, red swollen, hot to the touch, and thinks its infected, little itchy. Reason for Disposition  Can't remove live tick (after trying Care Advice)  Answer Assessment - Initial Assessment Questions 1. ATTACHED:  Is the tick still on the skin?  (e.g., yes, no, unsure)     Not attached to the skin anymore 2. ONSET - TICK STILL ATTACHED:  How long do you think the tick has been on your skin? (e.g., hours, days, unsure)  Note:  Is there a recent activity (camping, hiking) where the caller may have been exposed?     Not still attached 3. ONSET - TICK NOT STILL ATTACHED: If the tick has been removed, how long do you think the tick was attached before you removed it? (e.g., 5 hours, 2 days). When was this?     Believes it was attached from Sunday afternoon to Monday morning 4. LOCATION: Where is the tick bite located? (e.g., arm, leg)     Left side on rib cage area 5. TYPE of TICK: Is it a wood tick or a deer tick? (e.g., deer tick, wood tick; unsure)     It has a white spot on tick but unsure 6. SIZE of TICK: How big is the tick? (e.g., size of poppy seed, apple seed, watermelon seed; unsure) Note: Deer ticks can be the size of a poppy seed (nymph) or an apple seed (adult).       Poppy side 7. ENGORGED: Did the tick look flat or engorged (full, swollen)? (e.g., flat, engorged; unsure)     Flat   8. OTHER SYMPTOMS: Do you have any other symptoms? (e.g., fever, rash, redness at bite area, red ring around bite)     Itching, red, swollen, warm to the touch, red ring around bite, left knee pain  Denies: fever, headache, pain  Protocols used: Tick Bite-A-AH

## 2024-08-28 NOTE — Progress Notes (Signed)
 Charles Little                                          MRN: [id number]   08/28/2024   The [facility] Quality Team Specialist reviewed this patient medical record for the purposes of chart review for care gap closure. The following were reviewed: chart review for care gap closure-controlling blood pressure.    [facility] Quality Team

## 2024-10-06 ENCOUNTER — Encounter: Payer: Self-pay | Admitting: Family Medicine

## 2024-10-06 ENCOUNTER — Ambulatory Visit: Payer: Self-pay | Admitting: Family Medicine

## 2024-10-06 DIAGNOSIS — Z125 Encounter for screening for malignant neoplasm of prostate: Secondary | ICD-10-CM

## 2024-10-06 DIAGNOSIS — I10 Essential (primary) hypertension: Secondary | ICD-10-CM

## 2024-10-06 DIAGNOSIS — E782 Mixed hyperlipidemia: Secondary | ICD-10-CM

## 2024-10-06 DIAGNOSIS — Z0000 Encounter for general adult medical examination without abnormal findings: Secondary | ICD-10-CM
